# Patient Record
Sex: Female | Born: 1990 | Race: Black or African American | Hispanic: No | Marital: Single | Smoking: Never smoker
Health system: Southern US, Community
[De-identification: ages and names within clinical notes are randomized; demographics above are authoritative.]

## PROBLEM LIST (undated history)

## (undated) ENCOUNTER — Inpatient Hospital Stay (HOSPITAL_COMMUNITY): Payer: Self-pay

## (undated) DIAGNOSIS — K589 Irritable bowel syndrome without diarrhea: Secondary | ICD-10-CM

## (undated) HISTORY — PX: NO PAST SURGERIES: SHX2092

---

## 2012-05-09 ENCOUNTER — Encounter (HOSPITAL_COMMUNITY): Payer: Self-pay | Admitting: Emergency Medicine

## 2012-05-09 ENCOUNTER — Emergency Department (HOSPITAL_COMMUNITY)
Admission: EM | Admit: 2012-05-09 | Discharge: 2012-05-09 | Disposition: A | Discharge status: Home Health | Payer: No Typology Code available for payment source | Attending: Emergency Medicine | Admitting: Emergency Medicine

## 2012-05-09 ENCOUNTER — Emergency Department (HOSPITAL_COMMUNITY): Payer: No Typology Code available for payment source

## 2012-05-09 DIAGNOSIS — Y92410 Unspecified street and highway as the place of occurrence of the external cause: Secondary | ICD-10-CM | POA: Insufficient documentation

## 2012-05-09 DIAGNOSIS — M25511 Pain in right shoulder: Secondary | ICD-10-CM

## 2012-05-09 DIAGNOSIS — T148XXA Other injury of unspecified body region, initial encounter: Secondary | ICD-10-CM | POA: Insufficient documentation

## 2012-05-09 DIAGNOSIS — M25519 Pain in unspecified shoulder: Secondary | ICD-10-CM | POA: Insufficient documentation

## 2012-05-09 MED ORDER — NAPROXEN 250 MG PO TABS: 250.0000 mg | ORAL_TABLET | Freq: Two times a day (BID) | ORAL | Status: AC

## 2012-05-09 MED ORDER — ACETAMINOPHEN 325 MG PO TABS
650.0000 mg | ORAL_TABLET | Freq: Once | ORAL | Status: AC
  Administered 2012-05-09: 650 mg via ORAL
  Filled 2012-05-09: qty 2

## 2012-05-09 MED ORDER — IBUPROFEN 200 MG PO TABS
400.0000 mg | ORAL_TABLET | Freq: Once | ORAL | Status: AC
  Administered 2012-05-09: 400 mg via ORAL
  Filled 2012-05-09: qty 2

## 2012-05-09 MED ORDER — HYDROCODONE-ACETAMINOPHEN 5-325 MG PO TABS: ORAL_TABLET | ORAL | Status: AC

## 2012-05-09 MED ORDER — METAXALONE 800 MG PO TABS: 800.0000 mg | ORAL_TABLET | Freq: Three times a day (TID) | ORAL | Status: AC | PRN

## 2012-05-09 NOTE — Progress Notes (Signed)
Orthopedic Tech Progress Note Patient Details:  [name] 1991/07/10 [id number]  Other Ortho Devices Type of Ortho Device: Other (comment) (arm sling) Ortho Device Location: (R) UE Ortho Device Interventions: Application   [name] 05/09/2012, 1:04 PM
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
## 2012-05-09 NOTE — Discharge Instructions (Signed)
RESOURCE GUIDE  Dental Problems  Patients with Medicaid: [facility]                     [facility] [address]                                           [address] Phone:  [phone]                                                  Phone:  [phone]  If unable to pay or uninsured, contact:  [facility] or [facility] to become qualified for the adult dental clinic.  Chronic Pain Problems Contact [facility]  [phone] Patients need to be referred by their primary care doctor.  Insufficient Money for Medicine Contact [employer]:  call "211" or [facility] 271-5999.  No Primary Care Doctor Call [facility]  [phone] Other agencies that provide inexpensive medical care    [facility]  [phone]    [facility]  [phone]    [facility]  [phone]    Women[facility] Clinic  [phone]    [facility]  [phone]    [facility]  [phone]  Psychological Services [facility]  [phone] [facility]  [phone] [facility]   [phone] (emergency services [phone])  Substance Abuse Resources [facility]  336-882-2125 [facility] 336-784-9470 [facility] 336-285-9073 [facility] 336-845-3988 Residential & Outpatient Substance Abuse Program  800-659-3381  Abuse/Neglect [place] Child Abuse Hotline (336) 641-3795 [place] Child Abuse Hotline 800-378-5315 (After Hours)  Emergency Shelter [facility] (336) 271-5985  Maternity Homes [facility] (336) 275-9566 [facility] (704) 372-4663  MRSA Hotline #:   832-7006    [place] [facility]  [facility] [place]     [employer]                          [facility] [address] [place]                       [address]      [address]  [place]                                                [place]                            [place] Phone:  [phone]                                   Phone:  [phone]                 Phone:  [phone]  [facility] Phone:  [phone]    [facility] Child Abuse Hotline 430-789-0894 5344328569 (After Hours)   Take the prescriptions as directed.  Apply moist heat or ice to the area(s) of discomfort, for 15 minutes at a time, several times per day for the next few days.  Do not fall asleep on a heating or ice pack.  Wear the sling to rest your shoulder for the next 1 to 2 days, then remove and slowly resume your usual activities.  Call your regular medical doctor today and the Orthopedist to schedule a follow up appointment within the next week.  Return to the Emergency Department immediately if worsening.
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
## 2012-05-09 NOTE — ED Notes (Signed)
Discharge instructions reviewed with pt; verbalizes understanding.  No questions asked; no further c/o's voiced.  Sling intact to right shoulder.  Pt ambulatory to lobby.  NAD noted.

## 2016-08-10 LAB — OB RESULTS CONSOLE HEPATITIS B SURFACE ANTIGEN: Hepatitis B Surface Ag: NEGATIVE

## 2016-08-10 LAB — OB RESULTS CONSOLE RUBELLA ANTIBODY, IGM: Rubella: IMMUNE

## 2016-08-10 LAB — OB RESULTS CONSOLE RPR: RPR: NONREACTIVE

## 2016-08-10 LAB — OB RESULTS CONSOLE HIV ANTIBODY (ROUTINE TESTING): HIV: NONREACTIVE

## 2016-08-20 ENCOUNTER — Inpatient Hospital Stay (EMERGENCY_DEPARTMENT_HOSPITAL)
Admission: AD | Admit: 2016-08-20 | Discharge: 2016-08-20 | Disposition: A | Discharge status: Home / Self Care | Payer: 59 | Source: Ambulatory Visit | Attending: Obstetrics and Gynecology | Admitting: Obstetrics and Gynecology

## 2016-08-20 ENCOUNTER — Inpatient Hospital Stay (HOSPITAL_COMMUNITY)
Admission: AD | Admit: 2016-08-20 | Discharge: 2016-08-22 | DRG: 781 | Disposition: A | Discharge status: Home / Self Care | Payer: 59 | Source: Ambulatory Visit | Attending: Obstetrics & Gynecology | Admitting: Obstetrics & Gynecology

## 2016-08-20 ENCOUNTER — Other Ambulatory Visit (HOSPITAL_COMMUNITY): Payer: Self-pay | Admitting: Radiology

## 2016-08-20 ENCOUNTER — Encounter (HOSPITAL_COMMUNITY): Payer: Self-pay | Admitting: *Deleted

## 2016-08-20 ENCOUNTER — Encounter (HOSPITAL_COMMUNITY): Payer: Self-pay | Admitting: Radiology

## 2016-08-20 ENCOUNTER — Encounter (HOSPITAL_COMMUNITY): Payer: Self-pay

## 2016-08-20 ENCOUNTER — Inpatient Hospital Stay (HOSPITAL_COMMUNITY): Payer: 59

## 2016-08-20 DIAGNOSIS — B373 Candidiasis of vulva and vagina: Secondary | ICD-10-CM

## 2016-08-20 DIAGNOSIS — Z3A30 30 weeks gestation of pregnancy: Secondary | ICD-10-CM

## 2016-08-20 DIAGNOSIS — O98813 Other maternal infectious and parasitic diseases complicating pregnancy, third trimester: Secondary | ICD-10-CM | POA: Diagnosis not present

## 2016-08-20 DIAGNOSIS — O2303 Infections of kidney in pregnancy, third trimester: Principal | ICD-10-CM | POA: Diagnosis present

## 2016-08-20 DIAGNOSIS — K5900 Constipation, unspecified: Secondary | ICD-10-CM

## 2016-08-20 DIAGNOSIS — O26893 Other specified pregnancy related conditions, third trimester: Secondary | ICD-10-CM

## 2016-08-20 DIAGNOSIS — R109 Unspecified abdominal pain: Secondary | ICD-10-CM

## 2016-08-20 DIAGNOSIS — K589 Irritable bowel syndrome without diarrhea: Secondary | ICD-10-CM | POA: Insufficient documentation

## 2016-08-20 DIAGNOSIS — O26899 Other specified pregnancy related conditions, unspecified trimester: Secondary | ICD-10-CM

## 2016-08-20 DIAGNOSIS — R102 Pelvic and perineal pain: Secondary | ICD-10-CM | POA: Diagnosis present

## 2016-08-20 DIAGNOSIS — O99613 Diseases of the digestive system complicating pregnancy, third trimester: Secondary | ICD-10-CM | POA: Insufficient documentation

## 2016-08-20 DIAGNOSIS — K56609 Unspecified intestinal obstruction, unspecified as to partial versus complete obstruction: Secondary | ICD-10-CM

## 2016-08-20 DIAGNOSIS — B379 Candidiasis, unspecified: Secondary | ICD-10-CM

## 2016-08-20 HISTORY — DX: Irritable bowel syndrome, unspecified: K58.9

## 2016-08-20 LAB — COMPREHENSIVE METABOLIC PANEL
ALT: 20 U/L (ref 14–54)
ANION GAP: 10 (ref 5–15)
AST: 25 U/L (ref 15–41)
Albumin: 3.4 g/dL — ABNORMAL LOW (ref 3.5–5.0)
Alkaline Phosphatase: 67 U/L (ref 38–126)
BUN: 7 mg/dL (ref 6–20)
CALCIUM: 9.1 mg/dL (ref 8.9–10.3)
CHLORIDE: 102 mmol/L (ref 101–111)
CO2: 19 mmol/L — AB (ref 22–32)
Creatinine, Ser: 0.69 mg/dL (ref 0.44–1.00)
Glucose, Bld: 118 mg/dL — ABNORMAL HIGH (ref 65–99)
Potassium: 4 mmol/L (ref 3.5–5.1)
SODIUM: 131 mmol/L — AB (ref 135–145)
Total Bilirubin: 0.4 mg/dL (ref 0.3–1.2)
Total Protein: 7 g/dL (ref 6.5–8.1)

## 2016-08-20 LAB — CBC WITH DIFFERENTIAL/PLATELET
Basophils Absolute: 0 10*3/uL (ref 0.0–0.1)
Basophils Relative: 0 %
EOS ABS: 0 10*3/uL (ref 0.0–0.7)
EOS PCT: 0 %
HCT: 32.9 % — ABNORMAL LOW (ref 36.0–46.0)
Hemoglobin: 11.4 g/dL — ABNORMAL LOW (ref 12.0–15.0)
LYMPHS ABS: 1 10*3/uL (ref 0.7–4.0)
Lymphocytes Relative: 5 %
MCH: 27.5 pg (ref 26.0–34.0)
MCHC: 34.7 g/dL (ref 30.0–36.0)
MCV: 79.5 fL (ref 78.0–100.0)
MONO ABS: 1 10*3/uL (ref 0.1–1.0)
MONOS PCT: 5 %
Neutro Abs: 18.1 10*3/uL — ABNORMAL HIGH (ref 1.7–7.7)
Neutrophils Relative %: 90 %
PLATELETS: 231 10*3/uL (ref 150–400)
RBC: 4.14 MIL/uL (ref 3.87–5.11)
RDW: 12.9 % (ref 11.5–15.5)
WBC: 20.1 10*3/uL — ABNORMAL HIGH (ref 4.0–10.5)

## 2016-08-20 LAB — URINE MICROSCOPIC-ADD ON

## 2016-08-20 LAB — URINALYSIS, ROUTINE W REFLEX MICROSCOPIC
Bilirubin Urine: NEGATIVE
GLUCOSE, UA: NEGATIVE mg/dL
Hgb urine dipstick: NEGATIVE
KETONES UR: NEGATIVE mg/dL
Nitrite: NEGATIVE
PH: 6.5 (ref 5.0–8.0)
Protein, ur: NEGATIVE mg/dL
SPECIFIC GRAVITY, URINE: 1.025 (ref 1.005–1.030)

## 2016-08-20 LAB — LIPASE, BLOOD: LIPASE: 20 U/L (ref 11–51)

## 2016-08-20 LAB — WET PREP, GENITAL
CLUE CELLS WET PREP: NONE SEEN
Sperm: NONE SEEN
TRICH WET PREP: NONE SEEN

## 2016-08-20 LAB — LACTIC ACID, PLASMA: LACTIC ACID, VENOUS: 1.6 mmol/L (ref 0.5–1.9)

## 2016-08-20 MED ORDER — IOPAMIDOL (ISOVUE-300) INJECTION 61%
100.0000 mL | Freq: Once | INTRAVENOUS | Status: AC | PRN
  Administered 2016-08-20: 100 mL via INTRAVENOUS

## 2016-08-20 MED ORDER — DIATRIZOATE MEGLUMINE & SODIUM 66-10 % PO SOLN
30.0000 mL | Freq: Once | ORAL | Status: AC
  Administered 2016-08-20: 30 mL via ORAL
  Filled 2016-08-20: qty 30

## 2016-08-20 MED ORDER — SODIUM CHLORIDE 0.9 % IV SOLN
INTRAVENOUS | Status: DC
  Administered 2016-08-20 – 2016-08-21 (×2): via INTRAVENOUS

## 2016-08-20 MED ORDER — ZOLPIDEM TARTRATE 5 MG PO TABS
5.0000 mg | ORAL_TABLET | Freq: Every evening | ORAL | Status: DC | PRN
  Administered 2016-08-21 (×2): 5 mg via ORAL
  Filled 2016-08-20 (×2): qty 1

## 2016-08-20 MED ORDER — TERCONAZOLE 0.4 % VA CREA: 1.0000 | TOPICAL_CREAM | Freq: Every day | VAGINAL | 0 refills | Status: DC

## 2016-08-20 MED ORDER — PRENATAL MULTIVITAMIN CH
1.0000 | ORAL_TABLET | Freq: Every day | ORAL | Status: DC
  Administered 2016-08-21: 1 via ORAL
  Filled 2016-08-20 (×2): qty 1

## 2016-08-20 MED ORDER — ONDANSETRON HCL 4 MG/2ML IJ SOLN
4.0000 mg | Freq: Once | INTRAMUSCULAR | Status: AC
  Administered 2016-08-20: 4 mg via INTRAVENOUS
  Filled 2016-08-20: qty 2

## 2016-08-20 MED ORDER — DIATRIZOATE MEGLUMINE & SODIUM 66-10 % PO SOLN
15.0000 mL | Freq: Once | ORAL | Status: AC
  Administered 2016-08-20: 30 mL via ORAL
  Filled 2016-08-20: qty 30

## 2016-08-20 MED ORDER — ACETAMINOPHEN 325 MG PO TABS
650.0000 mg | ORAL_TABLET | ORAL | Status: DC | PRN
  Administered 2016-08-21: 650 mg via ORAL
  Filled 2016-08-20: qty 2

## 2016-08-20 MED ORDER — MORPHINE SULFATE (PF) 4 MG/ML IV SOLN
4.0000 mg | Freq: Once | INTRAVENOUS | Status: AC
  Administered 2016-08-20: 4 mg via INTRAVENOUS
  Filled 2016-08-20: qty 1

## 2016-08-20 MED ORDER — ONDANSETRON HCL 4 MG/2ML IJ SOLN
4.0000 mg | Freq: Once | INTRAMUSCULAR | Status: AC
Start: 2016-08-20 — End: 2016-08-20
  Administered 2016-08-20: 4 mg via INTRAVENOUS
  Filled 2016-08-20: qty 2

## 2016-08-20 MED ORDER — POLYETHYLENE GLYCOL 3350 17 G PO PACK
17.0000 g | PACK | Freq: Every day | ORAL | Status: DC
  Administered 2016-08-21: 17 g via ORAL
  Filled 2016-08-20 (×2): qty 1

## 2016-08-20 MED ORDER — SODIUM CHLORIDE 0.9 % IV BOLUS (SEPSIS)
1000.0000 mL | Freq: Once | INTRAVENOUS | Status: AC
  Administered 2016-08-20: 1000 mL via INTRAVENOUS

## 2016-08-20 MED ORDER — CALCIUM CARBONATE ANTACID 500 MG PO CHEW: 2.0000 | CHEWABLE_TABLET | ORAL | Status: DC | PRN

## 2016-08-20 MED ORDER — DOCUSATE SODIUM 100 MG PO CAPS
100.0000 mg | ORAL_CAPSULE | Freq: Three times a day (TID) | ORAL | Status: DC
  Administered 2016-08-21 (×4): 100 mg via ORAL
  Filled 2016-08-20 (×6): qty 1

## 2016-08-20 MED ORDER — CEFAZOLIN IN D5W 1 GM/50ML IV SOLN
1.0000 g | Freq: Three times a day (TID) | INTRAVENOUS | Status: DC
  Administered 2016-08-21 – 2016-08-22 (×5): 1 g via INTRAVENOUS
  Filled 2016-08-20 (×6): qty 50

## 2016-08-20 NOTE — MAU Note (Addendum)
Pt C/O lower abd pain, was seen in MAU this morning & had enema, pt states pain is worse than before.  Denies bleeding or LOF.  Has vomited twice today.  Pt had good results with enema but thinks she may still be constipated.

## 2016-08-20 NOTE — MAU Note (Signed)
Pt reports she woke up at  3am  feeling uncomfortable with contractions.Denies any vaginal bleeding but reports having a white mucusy discharge yesterday. Good fetal movement felt

## 2016-08-20 NOTE — MAU Provider Note (Signed)
History     CSN: [id number]  Arrival date and time: 08/20/16 1730   First Provider Initiated Contact with Patient 08/20/16 1844      Chief Complaint  Patient presents with  . Abdominal Pain   HPI Patient is a 25 year old G1 P0 at 30 weeks and 1 day who established prenatal care at 28 weeks due to a diagnosis of pregnancy. Patient did present to the MAU this morning with onset of abdominal pain after passing several rockhard stools. She was given a soapsuds enema with good results and discharged home. Patient attempted to go to work 8 a [employer] there began vomiting bile stained fluid and had severe cramping abdominal pain. She has not experienced uterine pain per se but more of a bloating colicky pain in the abdomen. She has not had any more bowel movement since discharge. She did take a dose of MiraLAX. She reports she just feels overall ill.  OB History    Gravida Para Term Preterm AB Living   1             SAB TAB Ectopic Multiple Live Births                  Past Medical History:  Diagnosis Date  . IBS (irritable bowel syndrome)     Past Surgical History:  Procedure Laterality Date  . NO PAST SURGERIES      Family History  Problem Relation Age of Onset  . Cancer Neg Hx   . Diabetes Neg Hx   . Hypertension Neg Hx     Social History  Substance Use Topics  . Smoking status: Never Smoker  . Smokeless tobacco: Never Used  . Alcohol use No    Allergies:  Allergies  Allergen Reactions  . Peanut Butter Flavor Anaphylaxis and Shortness Of Breath    Prescriptions Prior to Admission  Medication Sig Dispense Refill Last Dose  . metroNIDAZOLE (FLAGYL) 500 MG tablet Take 500 mg by mouth 2 (two) times daily.   08/20/2016 at Unknown time  . polyethylene glycol (MIRALAX / GLYCOLAX) packet Take 17 g by mouth daily.   08/20/2016 at Unknown time  . Prenatal Vit-Fe Fumarate-FA (PRENATAL MULTIVITAMIN) TABS tablet Take 1 tablet by mouth daily at 12 noon.   08/19/2016 at  Unknown time  . diphenhydrAMINE (BENADRYL) 25 MG tablet Take 50 mg by mouth every 6 (six) hours as needed. For allergies   Not Taking at Unknown time  . hydroxypropyl methylcellulose (ISOPTO TEARS) 2.5 % ophthalmic solution Place 2 drops into both eyes 4 (four) times daily as needed. For allergies   Not Taking at Unknown time  . terconazole (TERAZOL 7) 0.4 % vaginal cream Place 1 applicator vaginally at bedtime. For 7 nights (Patient not taking: Reported on 08/20/2016) 45 g 0 Not Taking at Unknown time    Review of Systems  Constitutional: Negative for chills and fever.  HENT: Negative for congestion.   Eyes: Negative for blurred vision and double vision.  Respiratory: Negative for cough and sputum production.   Cardiovascular: Negative for chest pain and palpitations.  Gastrointestinal: Positive for abdominal pain, constipation, nausea and vomiting. Negative for heartburn.  Genitourinary: Negative for dysuria, frequency and urgency.  Musculoskeletal: Negative for back pain and myalgias.  Skin: Negative for itching and rash.  Neurological: Negative for dizziness, seizures and headaches.  Endo/Heme/Allergies: Negative for environmental allergies. Does not bruise/bleed easily.  Psychiatric/Behavioral: Negative for depression.   Physical Exam   Blood pressure 114/71, pulse  105, temperature 100.7 F (38.2 C), temperature source Axillary, resp. rate 20, height 5\' 6"  (1.676 m), weight 235 lb (106.6 kg), SpO2 100 %.  Physical Exam  Constitutional: She is oriented to person, place, and time. She appears well-developed and well-nourished.  HENT:  Head: Normocephalic and atraumatic.  Eyes: Conjunctivae are normal. Pupils are equal, round, and reactive to light.  Neck: Normal range of motion. Neck supple.  Cardiovascular: Normal heart sounds.  Exam reveals no gallop and no friction rub.   No murmur heard. Minimally tachycardic  Respiratory: Effort normal and breath sounds normal. No respiratory  distress.  GI: Soft. There is tenderness.  Tympanic, minimal guarding  Musculoskeletal: Normal range of motion. She exhibits no edema or deformity.  Neurological: She is alert and oriented to person, place, and time. No cranial nerve deficit.  Skin: Skin is warm and dry. No rash noted. No erythema.  Psychiatric: She has a normal mood and affect. Her behavior is normal.   Results for orders placed or performed during the hospital encounter of 08/20/16 (from the past 24 hour(s))  CBC with Differential/Platelet     Status: Abnormal   Collection Time: 08/20/16  7:20 PM  Result Value Ref Range   WBC 20.1 (H) 4.0 - 10.5 K/uL   RBC 4.14 3.87 - 5.11 MIL/uL   Hemoglobin 11.4 (L) 12.0 - 15.0 g/dL   HCT 16.1 (L) 09.6 - 04.5 %   MCV 79.5 78.0 - 100.0 fL   MCH 27.5 26.0 - 34.0 pg   MCHC 34.7 30.0 - 36.0 g/dL   RDW 40.9 81.1 - 91.4 %   Platelets 231 150 - 400 K/uL   Neutrophils Relative % 90 %   Neutro Abs 18.1 (H) 1.7 - 7.7 K/uL   Lymphocytes Relative 5 %   Lymphs Abs 1.0 0.7 - 4.0 K/uL   Monocytes Relative 5 %   Monocytes Absolute 1.0 0.1 - 1.0 K/uL   Eosinophils Relative 0 %   Eosinophils Absolute 0.0 0.0 - 0.7 K/uL   Basophils Relative 0 %   Basophils Absolute 0.0 0.0 - 0.1 K/uL  Comprehensive metabolic panel     Status: Abnormal   Collection Time: 08/20/16  7:20 PM  Result Value Ref Range   Sodium 131 (L) 135 - 145 mmol/L   Potassium 4.0 3.5 - 5.1 mmol/L   Chloride 102 101 - 111 mmol/L   CO2 19 (L) 22 - 32 mmol/L   Glucose, Bld 118 (H) 65 - 99 mg/dL   BUN 7 6 - 20 mg/dL   Creatinine, Ser 7.82 0.44 - 1.00 mg/dL   Calcium 9.1 8.9 - 95.6 mg/dL   Total Protein 7.0 6.5 - 8.1 g/dL   Albumin 3.4 (L) 3.5 - 5.0 g/dL   AST 25 15 - 41 U/L   ALT 20 14 - 54 U/L   Alkaline Phosphatase 67 38 - 126 U/L   Total Bilirubin 0.4 0.3 - 1.2 mg/dL   GFR calc non Af Amer >60 >60 mL/min   GFR calc Af Amer >60 >60 mL/min   Anion gap 10 5 - 15  Lipase, blood     Status: None   Collection Time:  08/20/16  7:20 PM  Result Value Ref Range   Lipase 20 11 - 51 U/L  Culture, blood (Routine X 2) w Reflex to ID Panel     Status: None (Preliminary result)   Collection Time: 08/20/16  8:19 PM  Result Value Ref Range   Specimen Description BLOOD  LEFT ARM    Special Requests BOTTLES DRAWN AEROBIC AND ANAEROBIC 5CC    Culture PENDING    Report Status PENDING   Lactic acid, plasma     Status: None   Collection Time: 08/20/16  8:19 PM  Result Value Ref Range   Lactic Acid, Venous 1.6 0.5 - 1.9 mmol/L  Culture, blood (Routine X 2) w Reflex to ID Panel     Status: None (Preliminary result)   Collection Time: 08/20/16  8:33 PM  Result Value Ref Range   Specimen Description BLOOD LEFT ARM    Special Requests BOTTLES DRAWN AEROBIC ONLY 5CC    Culture PENDING    Report Status PENDING    Dg Abd 1 View  Result Date: 08/20/2016 CLINICAL DATA:  Thirty weeks pregnant, lower abdominal pain, vomiting, fever EXAM: ABDOMEN - 1 VIEW COMPARISON:  None. FINDINGS: Gravid uterus.  Vertex position. No evidence of bowel obstruction. No dilated loops of small bowel. Ascending and transverse colon are not decompressed. Visualized osseous structures are within normal limits. IMPRESSION: No evidence of bowel obstruction. Gravid uterus. Electronically Signed   By: Charline Bills M.D.   On: 08/20/2016 20:01   Ct Abdomen Pelvis W Contrast  Result Date: 08/20/2016 CLINICAL DATA:  Abdominal pain concerning for appendicitis. EXAM: CT ABDOMEN AND PELVIS WITH CONTRAST TECHNIQUE: Multidetector CT imaging of the abdomen and pelvis was performed using the standard protocol following bolus administration of intravenous contrast. CONTRAST:  ISOVUE-300 IOPAMIDOL (ISOVUE-300) INJECTION 61% COMPARISON:  None. FINDINGS: Lower chest and abdominal wall:  Negative for pneumonia. Hepatobiliary: No focal liver abnormality.No calcified gallstone or signs of acute appendicitis. Pancreas: Unremarkable. Spleen: Unremarkable.  Adrenals/Urinary Tract: Negative adrenals. Symmetric renal enhancement. No indication of pyelonephritis. Symmetric mild intra renal collecting system prominence, normal in this setting. Unremarkable bladder. Stomach/Bowel: No obstruction. The appendix is visible and unremarkable. Reproductive:Gravid uterus. No indication of previa or subplacental collection. No noted fetal anomaly, but CT is not a substitute for sonography. Vascular/Lymphatic: No acute vascular abnormality. No mass or adenopathy. Other: No ascites or pneumoperitoneum. Musculoskeletal: Negative. IMPRESSION: Negative.  No appendicitis or other explanation for pain. Electronically Signed   By: Marnee Spring M.D.   On: 08/20/2016 23:36   MAU Course  Procedures  MDM In the MAU patient initially appeared rather calm with some minimal abdominal pain but on further evaluation you could see that she was guarding. Patient did attempt initially upon arrival to get up and go to the bathroom she did strain significantly and had no success with having a bowel movement. Patient did feel warm upon her return to the room and temperature was checked and she was febrile to 100.9. At that time IV fluids and pain medicine IV no obvious medicine were started. A flatplate of the abdomen was obtained to look for bowel obstruction. Patient reported improvement of 08/05/2009 pain with dose of 4 mg IV morphine. CBC CMP lactate blood cultures and lipase were obtained.  CBC revealed elevated white count to 20. CMP with no significant abnormality lipase normal and lactate is still pending. I did see non-decompressed loops of bowel but no obstruction or distention on the flatplate of the abdomen. Case was discussed with radiology as well as Dr. Mora Appl at approximately 9:00 PM who recommended CT of the abdomen to assess for appendicitis. I discussed risk and benefits of CT versus MRI with the patient and she did opt for CT scan with oral and IV contrast. This order was  placed and proper  arrangements were made.   Care was signed out to Thressa ShellerHeather Quincy Boy at 1000PM Assessment and Plan   1. Constipation, unspecified constipation type   2. Bowel obstruction (HCC)   3. Abdominal pain affecting pregnancy    Admit     Ernestina Pennaicholas Schenk 08/20/2016, 9:35 PM

## 2016-08-20 NOTE — MAU Provider Note (Signed)
History     CSN: [id number]  Arrival date and time: 08/20/16 [id number]   First Provider Initiated Contact with Patient 08/20/16 [phone]      Chief Complaint  Patient presents with  . Contractions   Heather Murphy is a 25 y.o. G1P0 at 6356w1d who presents today with lower abdominal pain. She states that she she awoke around 0300, and vomited. She then noticed the pain. She states that she feels like this is related to constipation. She reports that her last BM was 08/18/16, and it was hard/formed. She denies any VB or LOF. She confirms normal fetal movement. She is on antibiotics for BV, and has "2 more days left".    Pelvic Pain  The patient's primary symptoms include pelvic pain. This is a new problem. The current episode started today (around 0300. ). The problem occurs intermittently. The problem has been unchanged. The pain is moderate. The problem affects both sides. She is pregnant. Associated symptoms include abdominal pain, constipation (last BM 08/18/16. "Hard balls". ) and vomiting (x1 this morning. ). Pertinent negatives include no chills, diarrhea, dysuria, fever, frequency, nausea or urgency. The vaginal discharge was mucoid. There has been no bleeding. Exacerbated by: certain postions.  She has tried nothing for the symptoms. Sexual activity: No intercourse in the last 24 hours.     Past Medical History:  Diagnosis Date  . IBS (irritable bowel syndrome)     Past Surgical History:  Procedure Laterality Date  . NO PAST SURGERIES      History reviewed. No pertinent family history.  Social History  Substance Use Topics  . Smoking status: Never Smoker  . Smokeless tobacco: Never Used  . Alcohol use No    Allergies:  Allergies  Allergen Reactions  . Peanut Butter Flavor Anaphylaxis and Shortness Of Breath    Prescriptions Prior to Admission  Medication Sig Dispense Refill Last Dose  . cetirizine (ZYRTEC) 10 MG tablet Take 20 mg by mouth daily.   Past Week at Unknown  .  diphenhydrAMINE (BENADRYL) 25 MG tablet Take 50 mg by mouth every 6 (six) hours as needed. For allergies   Past Month at Unknown  . hydroxypropyl methylcellulose (ISOPTO TEARS) 2.5 % ophthalmic solution Place 2 drops into both eyes 4 (four) times daily as needed. For allergies   Past Month at Unknown  . loratadine (CLARITIN) 10 MG tablet Take 10 mg by mouth daily.   Past Month at Unknown    Review of Systems  Constitutional: Negative for chills and fever.  Gastrointestinal: Positive for abdominal pain, constipation (last BM 08/18/16. "Hard balls". ) and vomiting (x1 this morning. ). Negative for diarrhea and nausea.  Genitourinary: Positive for pelvic pain. Negative for dysuria, frequency and urgency.       "I have to sit back to get it out."   Physical Exam   Blood pressure 116/57, pulse 102, temperature 98.1 F (36.7 C), temperature source Oral, resp. rate 18.  Physical Exam  Nursing note and vitals reviewed. Constitutional: She is oriented to person, place, and time. She appears well-developed and well-nourished. No distress.  HENT:  Head: Normocephalic.  Cardiovascular: Normal rate.   Respiratory: Effort normal.  GI: Soft. There is no tenderness. There is no rebound.  Genitourinary:  Genitourinary Comments: External: no lesion Vagina: small amount of thick, white, clumpy discharge Cervix: pink, smooth, no CMT. Closed/thick/ballotable.  Uterus: AGA   Neurological: She is alert and oriented to person, place, and time.  Skin: Skin is warm and  dry.  Psychiatric: She has a normal mood and affect.    FHT: 145, moderate with 15x15 accels, no decels Toco: no UCs  MAU Course  Procedures  MDM 0758: D/W Dr. Mora ApplPinn patient is ok for DC home. Treat yeast infection.  0805: Patient has had soap suds enema and reports good results. She reports feeling better.   Assessment and Plan   1. Constipation, unspecified constipation type   2. Yeast infection   3. [redacted] weeks gestation of  pregnancy    DC home Comfort measures reviewed  3rd Trimester precautions  PTL precautions  Fetal kick counts RX: terazol 7 as directed Return to MAU as needed FU with OB as planned  Follow-up Information    PINN, Sanjuana MaeWALDA STACIA, MD .   Specialty:  Obstetrics and Gynecology Contact information: [address] [place] [address] [phone]            Tawnya CrookHogan, Heather Donovan 08/20/2016, 6:50 AM

## 2016-08-20 NOTE — Discharge Instructions (Signed)
Constipation, Adult Constipation is when a person has fewer than three bowel movements a week, has difficulty having a bowel movement, or has stools that are dry, hard, or larger than normal. As people grow older, constipation is more common. A low-fiber diet, not taking in enough fluids, and taking certain medicines may make constipation worse.  CAUSES   Certain medicines, such as antidepressants, pain medicine, iron supplements, antacids, and water pills.   Certain diseases, such as diabetes, irritable bowel syndrome (IBS), thyroid disease, or depression.   Not drinking enough water.   Not eating enough fiber-rich foods.   Stress or travel.   Lack of physical activity or exercise.   Ignoring the urge to have a bowel movement.   Using laxatives too much.  SIGNS AND SYMPTOMS   Having fewer than three bowel movements a week.   Straining to have a bowel movement.   Having stools that are hard, dry, or larger than normal.   Feeling full or bloated.   Pain in the lower abdomen.   Not feeling relief after having a bowel movement.  DIAGNOSIS  Your health care provider will take a medical history and perform a physical exam. Further testing may be done for severe constipation. Some tests may include:  A barium enema X-ray to examine your rectum, colon, and, sometimes, your small intestine.   A sigmoidoscopy to examine your lower colon.   A colonoscopy to examine your entire colon. TREATMENT  Treatment will depend on the severity of your constipation and what is causing it. Some dietary treatments include drinking more fluids and eating more fiber-rich foods. Lifestyle treatments may include regular exercise. If these diet and lifestyle recommendations do not help, your health care provider may recommend taking over-the-counter laxative medicines to help you have bowel movements. Prescription medicines may be prescribed if over-the-counter medicines do not work.    HOME CARE INSTRUCTIONS   Eat foods that have a lot of fiber, such as fruits, vegetables, whole grains, and beans.  Limit foods high in fat and processed sugars, such as french fries, hamburgers, cookies, candies, and soda.   A fiber supplement may be added to your diet if you cannot get enough fiber from foods.   Drink enough fluids to keep your urine clear or pale yellow.   Exercise regularly or as directed by your health care provider.   Go to the restroom when you have the urge to go. Do not hold it.   Only take over-the-counter or prescription medicines as directed by your health care provider. Do not take other medicines for constipation without talking to your health care provider first.  SEEK IMMEDIATE MEDICAL CARE IF:   You have bright red blood in your stool.   Your constipation lasts for more than 4 days or gets worse.   You have abdominal or rectal pain.   You have thin, pencil-like stools.   You have unexplained weight loss. MAKE SURE YOU:   Understand these instructions.  Will watch your condition.  Will get help right away if you are not doing well or get worse.   This information is not intended to replace advice given to you by your health care provider. Make sure you discuss any questions you have with your health care provider.   Document Released: 09/14/2004 Document Revised: 01/07/2015 Document Reviewed: 09/28/2013 Elsevier Interactive Patient Education 2016 Else[employer] Inc[employer]  Monilial Vaginitis Vaginitis in a soreness, swelling and redness (inflammation) of the vagina and vulva. Monilial vaginitis is not  a sexually transmitted infection. °CAUSES  °Yeast vaginitis is caused by yeast (candida) that is normally found in your vagina. With a yeast infection, the candida has overgrown in number to a point that upsets the chemical balance. °SYMPTOMS  °· White, thick vaginal discharge. °· Swelling, itching, redness and irritation of the vagina and  possibly the lips of the vagina (vulva). °· Burning or painful urination. °· Painful intercourse. °DIAGNOSIS  °Things that may contribute to monilial vaginitis are: °· Postmenopausal and virginal states. °· Pregnancy. °· Infections. °· Being tired, sick or stressed, especially if you had monilial vaginitis in the past. °· Diabetes. Good control will help lower the chance. °· Birth control pills. °· Tight fitting garments. °· Using bubble bath, feminine sprays, douches or deodorant tampons. °· Taking certain medications that kill germs (antibiotics). °· Sporadic recurrence can occur if you become ill. °TREATMENT  °Your caregiver will give you medication. °· There are several kinds of anti monilial vaginal creams and suppositories specific for monilial vaginitis. For recurrent yeast infections, use a suppository or cream in the vagina 2 times a week, or as directed. °· Anti-monilial or steroid cream for the itching or irritation of the vulva may also be used. Get your caregiver's permission. °· Painting the vagina with methylene blue solution may help if the monilial cream does not work. °· Eating yogurt may help prevent monilial vaginitis. °HOME CARE INSTRUCTIONS  °· Finish all medication as prescribed. °· Do not have sex until treatment is completed or after your caregiver tells you it is okay. °· Take warm sitz baths. °· Do not douche. °· Do not use tampons, especially scented ones. °· Wear cotton underwear. °· Avoid tight pants and panty hose. °· Tell your sexual partner that you have a yeast infection. They should go to their caregiver if they have symptoms such as mild rash or itching. °· Your sexual partner should be treated as well if your infection is difficult to eliminate. °· Practice safer sex. Use condoms. °· Some vaginal medications cause latex condoms to fail. Vaginal medications that harm condoms are: °¨ Cleocin cream. °¨ Butoconazole (Femstat®). °¨ Terconazole (Terazol®) vaginal  suppository. °¨ Miconazole (Monistat®) (may be purchased over the counter). °SEEK MEDICAL CARE IF:  °· You have a temperature by mouth above 102° F (38.9° C). °· The infection is getting worse after 2 days of treatment. °· The infection is not getting better after 3 days of treatment. °· You develop blisters in or around your vagina. °· You develop vaginal bleeding, and it is not your menstrual period. °· You have pain when you urinate. °· You develop intestinal problems. °· You have pain with sexual intercourse. °  °This information is not intended to replace advice given to you by your health care provider. Make sure you discuss any questions you have with your health care provider. °  °Document Released: 09/26/2005 Document Revised: 03/10/2012 Document Reviewed: 06/20/2015 °Elsevier Interactive Patient Education ©2016 Elsevier Inc. ° °

## 2016-08-21 ENCOUNTER — Encounter (HOSPITAL_COMMUNITY): Payer: Self-pay

## 2016-08-21 LAB — GC/CHLAMYDIA PROBE AMP (~~LOC~~) NOT AT ARMC
Chlamydia: NEGATIVE
NEISSERIA GONORRHEA: NEGATIVE

## 2016-08-21 LAB — LACTIC ACID, PLASMA: LACTIC ACID, VENOUS: 1.3 mmol/L (ref 0.5–1.9)

## 2016-08-21 LAB — TYPE AND SCREEN
ABO/RH(D): AB POS
Antibody Screen: NEGATIVE

## 2016-08-21 LAB — ABO/RH: ABO/RH(D): AB POS

## 2016-08-21 MED ORDER — LACTATED RINGERS IV SOLN
INTRAVENOUS | Status: DC
  Administered 2016-08-21 – 2016-08-22 (×4): via INTRAVENOUS

## 2016-08-21 MED ORDER — MAGNESIUM CITRATE PO SOLN
1.0000 | Freq: Once | ORAL | Status: AC
Start: 2016-08-21 — End: 2016-08-21
  Administered 2016-08-21: 1 via ORAL
  Filled 2016-08-21: qty 296

## 2016-08-21 MED ORDER — MORPHINE SULFATE (PF) 4 MG/ML IV SOLN
4.0000 mg | Freq: Once | INTRAVENOUS | Status: AC
  Administered 2016-08-21: 4 mg via INTRAVENOUS
  Filled 2016-08-21: qty 1

## 2016-08-21 NOTE — H&P (Signed)
Heather Murphy is a 25 y.o. female G1P0 at 30 weeks 2 days admitted from MAU for severe abdominal pain, fever, chills, nausea and vomiting.  Fever in MAU tmax 100.9.   UA + Large LE WBC 20  Right lower quadrant tenderness so CT Scan done to R/o appendicitis.  Appy negative.   [facility]  OB History    Gravida Para Term Preterm AB Living   1             SAB TAB Ectopic Multiple Live Births                 Past Medical History:  Diagnosis Date  . IBS (irritable bowel syndrome)    Past Surgical History:  Procedure Laterality Date  . NO PAST SURGERIES     Family History: family history is not on file. Social History:  reports that she has never smoked. She has never used smokeless tobacco. She reports that she does not drink alcohol or use drugs.     Maternal Diabetes: No Genetic Screening: Normal Maternal Ultrasounds/Referrals: Normal Fetal Ultrasounds or other Referrals:  Other: Anatomy scan normal Maternal Substance Abuse:  No Significant Maternal Medications:  Meds include: Other: Flagyl Significant Maternal Lab Results:  None Other Comments:  None  Review of Systems  Constitutional: Positive for chills and fever.  Gastrointestinal: Positive for abdominal pain, nausea and vomiting.   Maternal Medical History:  Reason for admission: Nausea.  Severe abdominal pain, fever  Fetal activity: Perceived fetal activity is normal.   Last perceived fetal movement was within the past 24 hours.    Prenatal complications: Infection.   Prenatal Complications - Diabetes: none.    Dilation: Closed Effacement (%): Thick Station: Ballotable Exam by:: [email or web address],rnc Blood pressure 117/65, pulse 93, temperature 99.1 F (37.3 C), temperature source Axillary, resp. rate 20, height 5\' 6"  (1.676 m), weight 106.6 kg (235 lb), SpO2 100 %. Maternal Exam:  Abdomen: Patient reports the following abdominal tenderness: RLQ.  Fundal height is 30  cm.   Fetal presentation: vertex  Introitus: Normal vulva. Vagina is positive for vaginal discharge.  Ferning test: not done.  Nitrazine test: not done.  Cervix: Cervix evaluated by sterile speculum exam and digital exam.   By MAU Fellow: Closed/ thick / high  Fetal Exam Fetal Monitor Review: Baseline rate: 150.  Variability: moderate (6-25 bpm).   Pattern: no decelerations and accelerations present.    Fetal State Assessment: Category I - tracings are normal.     Physical Exam  Nursing note and vitals reviewed. Constitutional: She is oriented to person, place, and time.  GI: She exhibits distension. There is tenderness in the right lower quadrant. There is no rebound and no guarding.  Genitourinary: Vaginal discharge found.  Neurological: She is alert and oriented to person, place, and time.    Prenatal labs: ABO, Rh: --/--/AB POS (08/21 1920) Antibody: NEG (08/21 1920) Rubella:  Immune RPR:   NR HBsAg:   Neg HIV:   Neg GBS:  Not done  Assessment/Plan: 25 yo G1P0 at 30 weeks 2 days severe abdominal pain, fever, N/V  Possible pyelonephritis, appendicitis and bowel obstruction ruled out.  Urine and blood cultures pending  Ancef 2GM IV q8 hours IV hydration  Tylenol prn pain and fever Clear liquids for now  Heather Murphy 08/21/2016, 1:47 AM

## 2016-08-21 NOTE — Progress Notes (Signed)
HD#2 Pt states that her pain has improved dramatically. Now with only mild right flank pain and mild LAP. No fever. Cultures pending.  VSSAF IMP/ Likely pyelo Plan/ await cultures

## 2016-08-22 MED ORDER — CEPHALEXIN 500 MG PO CAPS: 500.0000 mg | ORAL_CAPSULE | Freq: Four times a day (QID) | ORAL | 0 refills | Status: AC

## 2016-08-22 MED ORDER — HYDROCORTISONE ACE-PRAMOXINE 2.5-1 % EX CREA: 1.0000 "application " | TOPICAL_CREAM | Freq: Two times a day (BID) | CUTANEOUS | 1 refills | Status: DC

## 2016-08-22 MED ORDER — FLUCONAZOLE 150 MG PO TABS: 150.0000 mg | ORAL_TABLET | ORAL | 0 refills | Status: AC

## 2016-08-22 NOTE — Discharge Summary (Signed)
Obstetric Discharge Summary Reason for Admission: Abdominal pain Prenatal Procedures: NST Intrapartum Procedures: NA, undelivered Postpartum Procedures: NA, undelivered Complications-Operative and Postpartum: NA, undelivered Hemoglobin  Date Value Ref Range Status  08/20/2016 11.4 (L) 12.0 - 15.0 g/dL Final   HCT  Date Value Ref Range Status  08/20/2016 32.9 (L) 36.0 - 46.0 % Final    Physical Exam:  General: alert, cooperative and appears stated age Abd soft, NT/ND, no CVA tenderness, no rebound or guarding NST reactive, cat 1 tracing  Discharge Diagnoses: Pyelonephritis, constipation  Discharge Information: Date: 08/22/2016 Activity: unrestricted Diet: routine Medications: Keflex, hydrocortisone-pramoxine Condition: improved Instructions: refer to practice specific booklet Discharge to: home Follow-up Information    PINN, Sanjuana MaeWALDA STACIA, MD Follow up in 1 week(s).   Specialty:  Obstetrics and Gynecology Why:  For a prenatal appointment Contact information: [address] [place] [address] [phone]            Almon HerculesOSS,Jaelynn Currier H. 08/22/2016, 9:47 AM

## 2016-08-23 LAB — URINE CULTURE: Culture: 50000 — AB

## 2016-08-25 LAB — CULTURE, BLOOD (ROUTINE X 2)
Culture: NO GROWTH
Culture: NO GROWTH

## 2016-10-03 ENCOUNTER — Other Ambulatory Visit: Payer: Self-pay | Admitting: Obstetrics and Gynecology

## 2016-10-08 LAB — OB RESULTS CONSOLE GBS: STREP GROUP B AG: NEGATIVE

## 2016-10-08 LAB — OB RESULTS CONSOLE GC/CHLAMYDIA
Chlamydia: NEGATIVE
GC PROBE AMP, GENITAL: NEGATIVE

## 2016-10-20 ENCOUNTER — Encounter (HOSPITAL_COMMUNITY): Payer: Self-pay

## 2016-10-20 ENCOUNTER — Inpatient Hospital Stay (HOSPITAL_COMMUNITY): Payer: 59 | Admitting: Anesthesiology

## 2016-10-20 ENCOUNTER — Inpatient Hospital Stay (HOSPITAL_COMMUNITY)
Admission: AD | Admit: 2016-10-20 | Discharge: 2016-10-24 | DRG: 765 | Disposition: A | Discharge status: Home / Self Care | Payer: 59 | Source: Ambulatory Visit | Attending: Obstetrics & Gynecology | Admitting: Obstetrics & Gynecology

## 2016-10-20 ENCOUNTER — Inpatient Hospital Stay (HOSPITAL_COMMUNITY)
Admission: AD | Admit: 2016-10-20 | Discharge: 2016-10-20 | Disposition: A | Discharge status: Home / Self Care | Payer: 59 | Source: Ambulatory Visit | Attending: Obstetrics & Gynecology | Admitting: Obstetrics & Gynecology

## 2016-10-20 DIAGNOSIS — Z6841 Body Mass Index (BMI) 40.0 and over, adult: Secondary | ICD-10-CM | POA: Diagnosis not present

## 2016-10-20 DIAGNOSIS — Z3A38 38 weeks gestation of pregnancy: Secondary | ICD-10-CM | POA: Diagnosis not present

## 2016-10-20 DIAGNOSIS — O99824 Streptococcus B carrier state complicating childbirth: Secondary | ICD-10-CM | POA: Diagnosis present

## 2016-10-20 DIAGNOSIS — O169 Unspecified maternal hypertension, unspecified trimester: Secondary | ICD-10-CM | POA: Insufficient documentation

## 2016-10-20 DIAGNOSIS — Z3A00 Weeks of gestation of pregnancy not specified: Secondary | ICD-10-CM | POA: Insufficient documentation

## 2016-10-20 DIAGNOSIS — O99214 Obesity complicating childbirth: Secondary | ICD-10-CM | POA: Diagnosis present

## 2016-10-20 DIAGNOSIS — O411230 Chorioamnionitis, third trimester, not applicable or unspecified: Secondary | ICD-10-CM | POA: Diagnosis present

## 2016-10-20 DIAGNOSIS — Z3403 Encounter for supervision of normal first pregnancy, third trimester: Secondary | ICD-10-CM | POA: Diagnosis present

## 2016-10-20 LAB — URINE MICROSCOPIC-ADD ON

## 2016-10-20 LAB — COMPREHENSIVE METABOLIC PANEL
ALBUMIN: 3.3 g/dL — AB (ref 3.5–5.0)
ALK PHOS: 108 U/L (ref 38–126)
ALT: 17 U/L (ref 14–54)
AST: 21 U/L (ref 15–41)
Anion gap: 12 (ref 5–15)
BILIRUBIN TOTAL: 0.4 mg/dL (ref 0.3–1.2)
BUN: 7 mg/dL (ref 6–20)
CALCIUM: 9.4 mg/dL (ref 8.9–10.3)
CO2: 18 mmol/L — AB (ref 22–32)
CREATININE: 0.6 mg/dL (ref 0.44–1.00)
Chloride: 102 mmol/L (ref 101–111)
GFR calc Af Amer: >60 mL/min (ref >60)
GFR calc non Af Amer: >60 mL/min (ref >60)
GLUCOSE: 71 mg/dL (ref 65–99)
Potassium: 4 mmol/L (ref 3.5–5.1)
SODIUM: 132 mmol/L — AB (ref 135–145)
TOTAL PROTEIN: 7.1 g/dL (ref 6.5–8.1)

## 2016-10-20 LAB — CBC
HEMATOCRIT: 33.4 % — AB (ref 36.0–46.0)
HEMOGLOBIN: 11.4 g/dL — AB (ref 12.0–15.0)
MCH: 27.6 pg (ref 26.0–34.0)
MCHC: 34.1 g/dL (ref 30.0–36.0)
MCV: 80.9 fL (ref 78.0–100.0)
Platelets: 199 10*3/uL (ref 150–400)
RBC: 4.13 MIL/uL (ref 3.87–5.11)
RDW: 14.1 % (ref 11.5–15.5)
WBC: 12.7 10*3/uL — ABNORMAL HIGH (ref 4.0–10.5)

## 2016-10-20 LAB — URINALYSIS, ROUTINE W REFLEX MICROSCOPIC
Bilirubin Urine: NEGATIVE
GLUCOSE, UA: NEGATIVE mg/dL
Ketones, ur: 40 mg/dL — AB
Nitrite: NEGATIVE
PH: 7.5 (ref 5.0–8.0)
Protein, ur: NEGATIVE mg/dL
SPECIFIC GRAVITY, URINE: 1.01 (ref 1.005–1.030)

## 2016-10-20 LAB — PROTEIN / CREATININE RATIO, URINE
Creatinine, Urine: 93 mg/dL
Protein Creatinine Ratio: 0.13 mg/mg{Cre} (ref 0.00–0.15)
Total Protein, Urine: 12 mg/dL

## 2016-10-20 LAB — OB RESULTS CONSOLE GBS: GBS: POSITIVE

## 2016-10-20 LAB — TYPE AND SCREEN
ABO/RH(D): AB POS
Antibody Screen: NEGATIVE

## 2016-10-20 MED ORDER — EPHEDRINE 5 MG/ML INJ: 10.0000 mg | INTRAVENOUS | Status: DC | PRN

## 2016-10-20 MED ORDER — FENTANYL 2.5 MCG/ML BUPIVACAINE 1/10 % EPIDURAL INFUSION (WH - ANES)
14.0000 mL/h | INTRAMUSCULAR | Status: DC | PRN
  Administered 2016-10-20 – 2016-10-21 (×2): 14 mL/h via EPIDURAL
  Filled 2016-10-20 (×2): qty 125

## 2016-10-20 MED ORDER — PHENYLEPHRINE 40 MCG/ML (10ML) SYRINGE FOR IV PUSH (FOR BLOOD PRESSURE SUPPORT)
80.0000 ug | PREFILLED_SYRINGE | INTRAVENOUS | Status: DC | PRN
  Filled 2016-10-20: qty 10

## 2016-10-20 MED ORDER — LACTATED RINGERS IV SOLN: 500.0000 mL | Freq: Once | INTRAVENOUS | Status: DC

## 2016-10-20 MED ORDER — OXYCODONE-ACETAMINOPHEN 5-325 MG PO TABS: 2.0000 | ORAL_TABLET | ORAL | Status: DC | PRN

## 2016-10-20 MED ORDER — ACETAMINOPHEN 325 MG PO TABS: 650.0000 mg | ORAL_TABLET | ORAL | Status: DC | PRN

## 2016-10-20 MED ORDER — PENICILLIN G POTASSIUM 5000000 UNITS IJ SOLR
2.5000 10*6.[IU] | INTRAVENOUS | Status: DC
  Administered 2016-10-20 – 2016-10-21 (×2): 2.5 10*6.[IU] via INTRAVENOUS
  Filled 2016-10-20 (×5): qty 2.5

## 2016-10-20 MED ORDER — LACTATED RINGERS IV SOLN
500.0000 mL | Freq: Once | INTRAVENOUS | Status: AC
  Administered 2016-10-20: 500 mL via INTRAVENOUS

## 2016-10-20 MED ORDER — LACTATED RINGERS IV SOLN
INTRAVENOUS | Status: DC
  Administered 2016-10-20 – 2016-10-21 (×2): via INTRAVENOUS

## 2016-10-20 MED ORDER — ONDANSETRON HCL 4 MG/2ML IJ SOLN: 4.0000 mg | Freq: Four times a day (QID) | INTRAMUSCULAR | Status: DC | PRN

## 2016-10-20 MED ORDER — OXYCODONE-ACETAMINOPHEN 5-325 MG PO TABS
1.0000 | ORAL_TABLET | Freq: Once | ORAL | Status: AC
  Administered 2016-10-20: 1 via ORAL
  Filled 2016-10-20: qty 1

## 2016-10-20 MED ORDER — FLEET ENEMA 7-19 GM/118ML RE ENEM: 1.0000 | ENEMA | RECTAL | Status: DC | PRN

## 2016-10-20 MED ORDER — LACTATED RINGERS IV SOLN
500.0000 mL | INTRAVENOUS | Status: DC | PRN
  Administered 2016-10-20 – 2016-10-21 (×2): 500 mL via INTRAVENOUS

## 2016-10-20 MED ORDER — OXYCODONE-ACETAMINOPHEN 5-325 MG PO TABS: 1.0000 | ORAL_TABLET | ORAL | Status: DC | PRN

## 2016-10-20 MED ORDER — SOD CITRATE-CITRIC ACID 500-334 MG/5ML PO SOLN
30.0000 mL | ORAL | Status: DC | PRN
  Administered 2016-10-20 – 2016-10-21 (×2): 30 mL via ORAL
  Filled 2016-10-20 (×2): qty 15

## 2016-10-20 MED ORDER — OXYTOCIN BOLUS FROM INFUSION: 500.0000 mL | Freq: Once | INTRAVENOUS | Status: DC

## 2016-10-20 MED ORDER — LIDOCAINE HCL (PF) 1 % IJ SOLN
INTRAMUSCULAR | Status: DC | PRN
  Administered 2016-10-20: 5 mL via EPIDURAL
  Administered 2016-10-20: 3 mL via EPIDURAL
  Administered 2016-10-20: 2 mL via EPIDURAL

## 2016-10-20 MED ORDER — PHENYLEPHRINE 40 MCG/ML (10ML) SYRINGE FOR IV PUSH (FOR BLOOD PRESSURE SUPPORT): 80.0000 ug | PREFILLED_SYRINGE | INTRAVENOUS | Status: DC | PRN

## 2016-10-20 MED ORDER — OXYTOCIN 40 UNITS IN LACTATED RINGERS INFUSION - SIMPLE MED: 2.5000 [IU]/h | INTRAVENOUS | Status: DC

## 2016-10-20 MED ORDER — LIDOCAINE HCL (PF) 1 % IJ SOLN
30.0000 mL | INTRAMUSCULAR | Status: DC | PRN
  Filled 2016-10-20: qty 30

## 2016-10-20 MED ORDER — DIPHENHYDRAMINE HCL 50 MG/ML IJ SOLN: 12.5000 mg | INTRAMUSCULAR | Status: DC | PRN

## 2016-10-20 MED ORDER — PENICILLIN G POTASSIUM 5000000 UNITS IJ SOLR
5.0000 10*6.[IU] | Freq: Once | INTRAVENOUS | Status: AC
  Administered 2016-10-20: 5 10*6.[IU] via INTRAVENOUS
  Filled 2016-10-20: qty 5

## 2016-10-20 NOTE — Anesthesia Preprocedure Evaluation (Signed)
Anesthesia Evaluation  Patient identified by MRN, date of birth, ID band Patient awake    Reviewed: Allergy & Precautions, NPO status , Patient's Chart, lab work & pertinent test results  Airway Mallampati: II  TM Distance: >3 FB Neck ROM: Full    Dental  (+) Teeth Intact, Dental Advisory Given   Pulmonary neg pulmonary ROS,    Pulmonary exam normal breath sounds clear to auscultation       Cardiovascular negative cardio ROS Normal cardiovascular exam Rhythm:Regular Rate:Normal     Neuro/Psych negative neurological ROS     GI/Hepatic negative GI ROS, Neg liver ROS,   Endo/Other  Morbid obesity  Renal/GU negative Renal ROS     Musculoskeletal negative musculoskeletal ROS (+)   Abdominal   Peds  Hematology  (+) Blood dyscrasia, anemia , Plt 199k   Anesthesia Other Findings Day of surgery medications reviewed with the patient.  Reproductive/Obstetrics (+) Pregnancy                             Anesthesia Physical Anesthesia Plan  ASA: III  Anesthesia Plan: Epidural   Post-op Pain Management:    Induction:   Airway Management Planned:   Additional Equipment:   Intra-op Plan:   Post-operative Plan:   Informed Consent: I have reviewed the patients History and Physical, chart, labs and discussed the procedure including the risks, benefits and alternatives for the proposed anesthesia with the patient or authorized representative who has indicated his/her understanding and acceptance.   Dental advisory given  Plan Discussed with:   Anesthesia Plan Comments: (Patient identified. Risks/Benefits/Options discussed with patient including but not limited to bleeding, infection, nerve damage, paralysis, failed block, incomplete pain control, headache, blood pressure changes, nausea, vomiting, reactions to medication both or allergic, itching and postpartum back pain. Confirmed with bedside  nurse the patient's most recent platelet count. Confirmed with patient that they are not currently taking any anticoagulation, have any bleeding history or any family history of bleeding disorders. Patient expressed understanding and wished to proceed. All questions were answered. )        Anesthesia Quick Evaluation

## 2016-10-20 NOTE — MAU Note (Signed)
Attempted PIV 3 times.

## 2016-10-20 NOTE — MAU Note (Signed)
Ctx getting stronger since this morning.

## 2016-10-20 NOTE — Anesthesia Procedure Notes (Signed)
Epidural Patient location during procedure: OB  Staffing Anesthesiologist: Dani Wallner EDWARD Performed: anesthesiologist   Preanesthetic Checklist Completed: patient identified, pre-op evaluation, timeout performed, IV checked, risks and benefits discussed and monitors and equipment checked  Epidural Patient position: sitting Prep: DuraPrep Patient monitoring: blood pressure and continuous pulse ox Approach: midline Location: L3-L4 Injection technique: LOR air  Needle:  Needle type: Tuohy  Needle gauge: 17 G Needle length: 9 cm Needle insertion depth: 7 cm Catheter size: 19 Gauge Catheter at skin depth: 12 cm Test dose: negative and Other (1% Lidocaine)  Additional Notes Patient identified.  Risk benefits discussed including failed block, incomplete pain control, headache, nerve damage, paralysis, blood pressure changes, nausea, vomiting, reactions to medication both toxic or allergic, and postpartum back pain.  Patient expressed understanding and wished to proceed.  All questions were answered.  Sterile technique used throughout procedure and epidural site dressed with sterile barrier dressing. No paresthesia or other complications noted. The patient did not experience any signs of intravascular injection such as tinnitus or metallic taste in mouth nor signs of intrathecal spread such as rapid motor block. Please see nursing notes for vital signs. Reason for block:procedure for pain     

## 2016-10-21 ENCOUNTER — Encounter (HOSPITAL_COMMUNITY)
Admission: AD | Disposition: A | Discharge status: Home / Self Care | Payer: Self-pay | Source: Ambulatory Visit | Attending: Obstetrics & Gynecology

## 2016-10-21 ENCOUNTER — Encounter (HOSPITAL_COMMUNITY): Payer: Self-pay | Admitting: *Deleted

## 2016-10-21 LAB — RPR: RPR Ser Ql: NONREACTIVE

## 2016-10-21 LAB — CBC
HCT: 28.9 % — ABNORMAL LOW (ref 36.0–46.0)
HEMOGLOBIN: 9.8 g/dL — AB (ref 12.0–15.0)
MCH: 27.2 pg (ref 26.0–34.0)
MCHC: 33.9 g/dL (ref 30.0–36.0)
MCV: 80.3 fL (ref 78.0–100.0)
Platelets: 195 10*3/uL (ref 150–400)
RBC: 3.6 MIL/uL — ABNORMAL LOW (ref 3.87–5.11)
RDW: 14 % (ref 11.5–15.5)
WBC: 15.8 10*3/uL — ABNORMAL HIGH (ref 4.0–10.5)

## 2016-10-21 SURGERY — Surgical Case
Anesthesia: Regional

## 2016-10-21 MED ORDER — TETANUS-DIPHTH-ACELL PERTUSSIS 5-2.5-18.5 LF-MCG/0.5 IM SUSP: 0.5000 mL | Freq: Once | INTRAMUSCULAR | Status: DC

## 2016-10-21 MED ORDER — OXYCODONE HCL 5 MG PO TABS
5.0000 mg | ORAL_TABLET | ORAL | Status: DC | PRN
  Administered 2016-10-22: 5 mg via ORAL
  Filled 2016-10-21: qty 1

## 2016-10-21 MED ORDER — MEPERIDINE HCL 25 MG/ML IJ SOLN
INTRAMUSCULAR | Status: AC
  Filled 2016-10-21: qty 1

## 2016-10-21 MED ORDER — NALBUPHINE HCL 10 MG/ML IJ SOLN: 5.0000 mg | INTRAMUSCULAR | Status: DC | PRN

## 2016-10-21 MED ORDER — MORPHINE SULFATE (PF) 0.5 MG/ML IJ SOLN
INTRAMUSCULAR | Status: AC
  Filled 2016-10-21: qty 10

## 2016-10-21 MED ORDER — LACTATED RINGERS IV SOLN
INTRAVENOUS | Status: DC | PRN
  Administered 2016-10-21: 04:00:00 via INTRAVENOUS

## 2016-10-21 MED ORDER — SODIUM CHLORIDE 0.9 % IR SOLN
Status: DC | PRN
  Administered 2016-10-21: 1

## 2016-10-21 MED ORDER — LACTATED RINGERS IV SOLN
INTRAVENOUS | Status: DC
  Administered 2016-10-21 – 2016-10-22 (×3): via INTRAVENOUS

## 2016-10-21 MED ORDER — NALOXONE HCL 0.4 MG/ML IJ SOLN: 0.4000 mg | INTRAMUSCULAR | Status: DC | PRN

## 2016-10-21 MED ORDER — NALOXONE HCL 2 MG/2ML IJ SOSY
1.0000 ug/kg/h | PREFILLED_SYRINGE | INTRAVENOUS | Status: DC | PRN
  Filled 2016-10-21: qty 2

## 2016-10-21 MED ORDER — IBUPROFEN 600 MG PO TABS
600.0000 mg | ORAL_TABLET | Freq: Four times a day (QID) | ORAL | Status: DC
  Administered 2016-10-21 – 2016-10-24 (×11): 600 mg via ORAL
  Filled 2016-10-21 (×12): qty 1

## 2016-10-21 MED ORDER — DIPHENHYDRAMINE HCL 25 MG PO CAPS
25.0000 mg | ORAL_CAPSULE | ORAL | Status: DC | PRN
  Administered 2016-10-21: 25 mg via ORAL

## 2016-10-21 MED ORDER — LIDOCAINE-EPINEPHRINE (PF) 2 %-1:200000 IJ SOLN
INTRAMUSCULAR | Status: AC
  Filled 2016-10-21: qty 20

## 2016-10-21 MED ORDER — MENTHOL 3 MG MT LOZG: 1.0000 | LOZENGE | OROMUCOSAL | Status: DC | PRN

## 2016-10-21 MED ORDER — ZOLPIDEM TARTRATE 5 MG PO TABS
5.0000 mg | ORAL_TABLET | Freq: Every evening | ORAL | Status: DC | PRN
  Administered 2016-10-24: 5 mg via ORAL
  Filled 2016-10-21: qty 1

## 2016-10-21 MED ORDER — SENNOSIDES-DOCUSATE SODIUM 8.6-50 MG PO TABS
2.0000 | ORAL_TABLET | ORAL | Status: DC
  Administered 2016-10-22 – 2016-10-23 (×3): 2 via ORAL
  Filled 2016-10-21 (×2): qty 2

## 2016-10-21 MED ORDER — OXYTOCIN 10 UNIT/ML IJ SOLN
INTRAVENOUS | Status: DC | PRN
  Administered 2016-10-21: 40 [IU] via INTRAVENOUS

## 2016-10-21 MED ORDER — KETOROLAC TROMETHAMINE 30 MG/ML IJ SOLN: 30.0000 mg | Freq: Four times a day (QID) | INTRAMUSCULAR | Status: AC | PRN

## 2016-10-21 MED ORDER — BUPIVACAINE HCL (PF) 0.25 % IJ SOLN
INTRAMUSCULAR | Status: AC
  Filled 2016-10-21: qty 10

## 2016-10-21 MED ORDER — SCOPOLAMINE 1 MG/3DAYS TD PT72
MEDICATED_PATCH | TRANSDERMAL | Status: DC | PRN
  Administered 2016-10-21: 1 via TRANSDERMAL

## 2016-10-21 MED ORDER — DIPHENHYDRAMINE HCL 50 MG/ML IJ SOLN: 12.5000 mg | INTRAMUSCULAR | Status: DC | PRN

## 2016-10-21 MED ORDER — OXYTOCIN 10 UNIT/ML IJ SOLN
INTRAMUSCULAR | Status: AC
  Filled 2016-10-21: qty 4

## 2016-10-21 MED ORDER — LACTATED RINGERS IV SOLN
INTRAVENOUS | Status: DC | PRN
  Administered 2016-10-21 (×2): via INTRAVENOUS

## 2016-10-21 MED ORDER — PROMETHAZINE HCL 25 MG/ML IJ SOLN: 6.2500 mg | INTRAMUSCULAR | Status: DC | PRN

## 2016-10-21 MED ORDER — PRENATAL MULTIVITAMIN CH
1.0000 | ORAL_TABLET | Freq: Every day | ORAL | Status: DC
  Administered 2016-10-21 – 2016-10-24 (×4): 1 via ORAL
  Filled 2016-10-21 (×4): qty 1

## 2016-10-21 MED ORDER — SODIUM CHLORIDE 0.9% FLUSH: 3.0000 mL | INTRAVENOUS | Status: DC | PRN

## 2016-10-21 MED ORDER — SIMETHICONE 80 MG PO CHEW
80.0000 mg | CHEWABLE_TABLET | ORAL | Status: DC | PRN
  Filled 2016-10-21: qty 1

## 2016-10-21 MED ORDER — OXYCODONE HCL 5 MG PO TABS: 10.0000 mg | ORAL_TABLET | ORAL | Status: DC | PRN

## 2016-10-21 MED ORDER — FENTANYL CITRATE (PF) 100 MCG/2ML IJ SOLN: 25.0000 ug | INTRAMUSCULAR | Status: DC | PRN

## 2016-10-21 MED ORDER — MEPERIDINE HCL 25 MG/ML IJ SOLN: 6.2500 mg | INTRAMUSCULAR | Status: DC | PRN

## 2016-10-21 MED ORDER — CEFAZOLIN SODIUM-DEXTROSE 2-3 GM-% IV SOLR
INTRAVENOUS | Status: DC | PRN
  Administered 2016-10-21: 2 g via INTRAVENOUS

## 2016-10-21 MED ORDER — ACETAMINOPHEN 500 MG PO TABS
1000.0000 mg | ORAL_TABLET | ORAL | Status: DC | PRN
  Administered 2016-10-21: 1000 mg via ORAL
  Filled 2016-10-21: qty 2

## 2016-10-21 MED ORDER — ACETAMINOPHEN 500 MG PO TABS
1000.0000 mg | ORAL_TABLET | Freq: Four times a day (QID) | ORAL | Status: AC
  Administered 2016-10-21 (×2): 1000 mg via ORAL
  Filled 2016-10-21 (×2): qty 2

## 2016-10-21 MED ORDER — SIMETHICONE 80 MG PO CHEW
80.0000 mg | CHEWABLE_TABLET | ORAL | Status: DC
  Administered 2016-10-22 – 2016-10-23 (×2): 80 mg via ORAL
  Filled 2016-10-21 (×2): qty 1

## 2016-10-21 MED ORDER — COCONUT OIL OIL: 1.0000 "application " | TOPICAL_OIL | Status: DC | PRN

## 2016-10-21 MED ORDER — SODIUM BICARBONATE 8.4 % IV SOLN
INTRAVENOUS | Status: AC
  Filled 2016-10-21: qty 50

## 2016-10-21 MED ORDER — OXYTOCIN 40 UNITS IN LACTATED RINGERS INFUSION - SIMPLE MED: 2.5000 [IU]/h | INTRAVENOUS | Status: AC

## 2016-10-21 MED ORDER — ACETAMINOPHEN 325 MG PO TABS
650.0000 mg | ORAL_TABLET | ORAL | Status: DC | PRN
  Administered 2016-10-22 (×2): 650 mg via ORAL
  Filled 2016-10-21 (×2): qty 2

## 2016-10-21 MED ORDER — SCOPOLAMINE 1 MG/3DAYS TD PT72
MEDICATED_PATCH | TRANSDERMAL | Status: AC
  Filled 2016-10-21: qty 1

## 2016-10-21 MED ORDER — SIMETHICONE 80 MG PO CHEW
80.0000 mg | CHEWABLE_TABLET | Freq: Three times a day (TID) | ORAL | Status: DC
  Administered 2016-10-21 – 2016-10-24 (×10): 80 mg via ORAL
  Filled 2016-10-21 (×10): qty 1

## 2016-10-21 MED ORDER — MEPERIDINE HCL 25 MG/ML IJ SOLN
INTRAMUSCULAR | Status: DC | PRN
  Administered 2016-10-21 (×2): 12.5 mg via INTRAVENOUS

## 2016-10-21 MED ORDER — MORPHINE SULFATE (PF) 0.5 MG/ML IJ SOLN
INTRAMUSCULAR | Status: DC | PRN
  Administered 2016-10-21: 4 mg via EPIDURAL

## 2016-10-21 MED ORDER — BUPIVACAINE HCL 0.25 % IJ SOLN
INTRAMUSCULAR | Status: DC | PRN
  Administered 2016-10-21: 10 mL

## 2016-10-21 MED ORDER — SODIUM CHLORIDE 0.9 % IV SOLN
3.0000 g | Freq: Four times a day (QID) | INTRAVENOUS | Status: AC
  Administered 2016-10-21 – 2016-10-22 (×6): 3 g via INTRAVENOUS
  Filled 2016-10-21 (×7): qty 3

## 2016-10-21 MED ORDER — SODIUM BICARBONATE 8.4 % IV SOLN
INTRAVENOUS | Status: DC | PRN
  Administered 2016-10-21 (×3): 5 mL via EPIDURAL

## 2016-10-21 MED ORDER — ONDANSETRON HCL 4 MG/2ML IJ SOLN: 4.0000 mg | Freq: Three times a day (TID) | INTRAMUSCULAR | Status: DC | PRN

## 2016-10-21 MED ORDER — DIBUCAINE 1 % RE OINT: 1.0000 "application " | TOPICAL_OINTMENT | RECTAL | Status: DC | PRN

## 2016-10-21 MED ORDER — ONDANSETRON HCL 4 MG/2ML IJ SOLN
INTRAMUSCULAR | Status: AC
  Filled 2016-10-21: qty 2

## 2016-10-21 MED ORDER — WITCH HAZEL-GLYCERIN EX PADS: 1.0000 "application " | MEDICATED_PAD | CUTANEOUS | Status: DC | PRN

## 2016-10-21 MED ORDER — DIPHENHYDRAMINE HCL 25 MG PO CAPS
25.0000 mg | ORAL_CAPSULE | Freq: Four times a day (QID) | ORAL | Status: DC | PRN
  Filled 2016-10-21: qty 1

## 2016-10-21 MED ORDER — NALBUPHINE HCL 10 MG/ML IJ SOLN: 5.0000 mg | Freq: Once | INTRAMUSCULAR | Status: DC | PRN

## 2016-10-21 MED ORDER — CEFAZOLIN SODIUM-DEXTROSE 2-4 GM/100ML-% IV SOLN
INTRAVENOUS | Status: AC
Start: 2016-10-21 — End: 2016-10-21
  Filled 2016-10-21: qty 100

## 2016-10-21 MED ORDER — ONDANSETRON HCL 4 MG/2ML IJ SOLN
INTRAMUSCULAR | Status: DC | PRN
  Administered 2016-10-21: 4 mg via INTRAVENOUS

## 2016-10-21 SURGICAL SUPPLY — 38 items
APL SKNCLS STERI-STRIP NONHPOA (GAUZE/BANDAGES/DRESSINGS) ×1
BENZOIN TINCTURE PRP APPL 2/3 (GAUZE/BANDAGES/DRESSINGS) ×3 IMPLANT
CHLORAPREP W/TINT 26ML (MISCELLANEOUS) ×3 IMPLANT
CLAMP CORD UMBIL (MISCELLANEOUS) IMPLANT
CLOSURE WOUND 1/2 X4 (GAUZE/BANDAGES/DRESSINGS) ×1
CLOTH BEACON ORANGE TIMEOUT ST (SAFETY) ×3 IMPLANT
DRSG OPSITE POSTOP 4X10 (GAUZE/BANDAGES/DRESSINGS) ×3 IMPLANT
ELECT REM PT RETURN 9FT ADLT (ELECTROSURGICAL) ×3
ELECTRODE REM PT RTRN 9FT ADLT (ELECTROSURGICAL) ×1 IMPLANT
EXTRACTOR VACUUM BELL STYLE (SUCTIONS) IMPLANT
EXTRACTOR VACUUM KIWI (MISCELLANEOUS) IMPLANT
GLOVE BIO SURGEON STRL SZ 6.5 (GLOVE) ×2 IMPLANT
GLOVE BIO SURGEONS STRL SZ 6.5 (GLOVE) ×1
GLOVE BIOGEL PI IND STRL 7.0 (GLOVE) ×2 IMPLANT
GLOVE BIOGEL PI INDICATOR 7.0 (GLOVE) ×4
GOWN STRL REUS W/TWL LRG LVL3 (GOWN DISPOSABLE) ×9 IMPLANT
KIT ABG SYR 3ML LUER SLIP (SYRINGE) IMPLANT
NEEDLE HYPO 22GX1.5 SAFETY (NEEDLE) IMPLANT
NEEDLE HYPO 25X5/8 SAFETYGLIDE (NEEDLE) IMPLANT
NS IRRIG 1000ML POUR BTL (IV SOLUTION) ×3 IMPLANT
PACK C SECTION WH (CUSTOM PROCEDURE TRAY) ×3 IMPLANT
PAD OB MATERNITY 4.3X12.25 (PERSONAL CARE ITEMS) ×3 IMPLANT
PENCIL SMOKE EVAC W/HOLSTER (ELECTROSURGICAL) ×3 IMPLANT
RTRCTR C-SECT PINK 25CM LRG (MISCELLANEOUS) ×3 IMPLANT
SPONGE LAP 18X18 X RAY DECT (DISPOSABLE) ×2 IMPLANT
STRIP CLOSURE SKIN 1/2X4 (GAUZE/BANDAGES/DRESSINGS) ×2 IMPLANT
SUT CHROMIC 2 0 CT 1 (SUTURE) ×6 IMPLANT
SUT MNCRL 0 VIOLET CTX 36 (SUTURE) ×2 IMPLANT
SUT MONOCRYL 0 CTX 36 (SUTURE) ×4
SUT PDS AB 0 CTX 36 PDP370T (SUTURE) IMPLANT
SUT PLAIN 2 0 (SUTURE) ×3
SUT PLAIN ABS 2-0 CT1 27XMFL (SUTURE) IMPLANT
SUT VIC AB 0 CTX 36 (SUTURE) ×6
SUT VIC AB 0 CTX36XBRD ANBCTRL (SUTURE) ×2 IMPLANT
SUT VIC AB 4-0 KS 27 (SUTURE) ×3 IMPLANT
SYR CONTROL 10ML LL (SYRINGE) IMPLANT
TOWEL OR 17X24 6PK STRL BLUE (TOWEL DISPOSABLE) ×3 IMPLANT
TRAY FOLEY CATH SILVER 14FR (SET/KITS/TRAYS/PACK) ×3 IMPLANT

## 2016-10-21 NOTE — H&P (Signed)
Heather Murphy is a 25 y.o. female presenting for regular painful contractions and blood show.  She denies leaking of fluid, notes mild HA.  Normal FM  OB History    Gravida Para Term Preterm AB Living   1             SAB TAB Ectopic Multiple Live Births                 Past Medical History:  Diagnosis Date  . IBS (irritable bowel syndrome)    Past Surgical History:  Procedure Laterality Date  . NO PAST SURGERIES     Family History: family history is not on file. Social History:  reports that she has never smoked. She has never used smokeless tobacco. She reports that she does not drink alcohol or use drugs.     Maternal Diabetes: No Genetic Screening: Normal NIPT XY Maternal Ultrasounds/Referrals: Normal Fetal Ultrasounds or other Referrals:  Other: Normal Anatomy scan Maternal Substance Abuse:  No Significant Maternal Medications:  None Significant Maternal Lab Results:  Lab values include: Group B Strep positive Other Comments:  None  Review of Systems  Constitutional: Negative for chills and fever.  Eyes: Negative for blurred vision and double vision.  Respiratory: Negative for cough.   Cardiovascular: Negative for chest pain and palpitations.  Gastrointestinal: Negative for heartburn and nausea.  Genitourinary: Negative for dysuria.  Musculoskeletal: Negative for myalgias and neck pain.  Neurological: Positive for headaches.   Maternal Medical History:  Reason for admission: Contractions.  Nausea.  Contractions: Onset was 6-12 hours ago.   Frequency: regular.   Duration is approximately 60 seconds.   Perceived severity is strong.    Fetal activity: Perceived fetal activity is normal.   Last perceived fetal movement was within the past 12 hours.    Prenatal complications: Late entry to prenatal care at 28 weeks  Prenatal Complications - Diabetes: none.    Dilation: 8.5 Effacement (%): 100 Station: 0 Exam by:: dr Acadia Thammavong Blood pressure (!) 115/49, pulse  (!) 107, temperature 99.5 F (37.5 C), temperature source Oral, resp. rate 16, height 5\' 5"  (1.651 m), weight 111.6 kg (246 lb), SpO2 99 %. Maternal Exam:  Uterine Assessment: Contraction strength is firm.  Contraction duration is 60 seconds. Contraction frequency is regular.   Abdomen: Patient reports no abdominal tenderness. Fundal height is 39 cm.   Estimated fetal weight is 3000 grams.   Fetal presentation: vertex  Introitus: Normal vulva. Normal vagina.  Ferning test: not done.  Amniotic fluid character: not assessed.  Cervix: Cervix evaluated by digital exam.   On Admission in MAU: 4/90/-1  Fetal Exam Fetal Monitor Review: Baseline rate: on admission:140.  Variability: moderate (6-25 bpm).   Pattern: accelerations present and no decelerations.    Fetal State Assessment: Category I - tracings are normal.     Physical Exam  Vitals reviewed. Constitutional: She is oriented to person, place, and time. She appears well-developed and well-nourished.  HENT:  Head: Normocephalic.  Neurological: She is alert and oriented to person, place, and time.    Prenatal labs: ABO, Rh: --/--/AB POS (10/21 1858) Antibody: NEG (10/21 1858) Rubella: Immune (08/11 0000) RPR: Nonreactive (08/11 0000)  HBsAg: Negative (08/11 0000)  HIV: Non-reactive (08/11 0000)  GBS: Positive (10/21 0000)   Assessment/Plan: 25 yo G3P0020 at 38 week 6 days in early active labor Admit to L&D Continuous monitoring.  PIH labs drawn previously in MAU negative Epidural  Active management of labor   Hertha Gergen,  Viviane Semidey STACIA 10/21/2016, 2:08 AM

## 2016-10-21 NOTE — Progress Notes (Signed)
Preoperative Note  Fetal Heart Tracing with recurrent late decelerations.  Although there is moderate variability  Discussed with patient that the head is still quite high in the pelvis and she is 9cm and baby  May not tolerate pushing in the second stage of labor. There is also now e/o Intraamniotic Infection. As this is not an indication for c-section, it compounds the problem.    Discussed with patient risks of surgery to include, bleeding, injury to the uterus including other organs. She voiced understanding and agrees to proceed with a primary LTCS.   Reynard Christoffersen STACIA

## 2016-10-21 NOTE — Lactation Note (Addendum)
This note was copied from a baby's chart. Lactation Consultation Note  Patient Name: Heather Murphy AVWUJ'WToday's Date: 10/21/2016 Reason for consult: Initial assessment;NICU baby   Initial assessment with first time mom of 39 week infant in NICU for r/o sepsis and 02 requirements. Infant is doing better per parents. [facility].   Mom wishes to BF, DEBP set up with instructions for cleaning, set up, pumping on Initiate setting, assembling, disassembling of pump parts. Providing Milk for Your Baby in NICU Booklet given, Reviewed pumping and what to expect, supply and demand and milk coming to volume and breastmilk storage for NICU infant. . Discussed importance pf pumping every 2-3 hours with DEBP on Initiate setting followed by hand expression. Enc mom to take a 6 hour stretch at night with no pumping for rest. Mom was shown how to hand express and returned demonstration. Mom with large pendulous compressible breasts. Left areola is thick and nipple flattens with compression, right areola is compressible with an everted nipple. Glistening of colostrum noted to right nipple. Nipple care reviewed to use EBM first followed by Coconut or Olive oil.   PGM was in room and was very negative about BF, FOB asked his mother to leave. Reviewed benefits of BF with family, mom reports she is still planning to BF her infant. She reports she does not want infant to get formula, discussed that we need to try and get her milk in and she will need to discuss with NICU, mom voiced she has spoken with Dr. Mikle Boswortharlos this am.   Colostrum collection containers, yellow stickers, bottles given. Dad to ask for EBM labels in NICU. [facility] given, mom informed of IP/OP Services, BF Support Groups and LC phone #. Enc mom to call with questions/concerns prn. Mom has a Medela PIS at home and an Organic pump. Discussed pumps in NICU with mom and enc her to pump after visiting infant. Follow up tomorrow and  PRN.    Maternal Data Formula Feeding for Exclusion: No Has patient been taught Hand Expression?: Yes Does the patient have breastfeeding experience prior to this delivery?: No  Feeding    LATCH Score/Interventions                      Lactation Tools Discussed/Used WIC Program: No Pump Review: Setup, frequency, and cleaning;Milk Storage Initiated by:: Noralee StainSharon Hice, RN, IBCLC Date initiated:: 10/21/16   Consult Status Consult Status: Follow-up Date: 10/22/16 Follow-up type: In-patient    Silas FloodSharon S Hice 10/21/2016, 10:39 AM

## 2016-10-21 NOTE — Addendum Note (Signed)
Addendum  created 10/21/16 1521 by Renford DillsJanet L Neal Trulson, CRNA   Sign clinical note

## 2016-10-21 NOTE — Anesthesia Postprocedure Evaluation (Signed)
Anesthesia Post Note  Patient: Heather Murphy  Procedure(s) Performed: Procedure(s) (LRB): CESAREAN SECTION (N/A)  Patient location during evaluation: [facility] Anesthesia Type: Epidural Level of consciousness: awake Pain management: pain level controlled Vital Signs Assessment: post-procedure vital signs reviewed and stable Respiratory status: spontaneous breathing Cardiovascular status: stable Postop Assessment: no headache, no backache, epidural receding, patient able to bend at knees, no signs of nausea or vomiting and adequate PO intake Anesthetic complications: no     Last Vitals:  Vitals:   10/21/16 1208 10/21/16 1430  BP: 112/70 105/71  Pulse: (!) 104 87  Resp: 18 18  Temp: 36.8 C 36.7 C    Last Pain:  Vitals:   10/21/16 1430  TempSrc: Oral  PainSc:    Pain Goal: Patients Stated Pain Goal: 4 (10/21/16 0715)               Fanny DanceMULLINS,[name]

## 2016-10-21 NOTE — Anesthesia Postprocedure Evaluation (Signed)
Anesthesia Post Note  Patient: Heather Murphy  Procedure(s) Performed: Procedure(s) (LRB): CESAREAN SECTION (N/A)  Patient location during evaluation: PACU Anesthesia Type: Epidural Level of consciousness: awake, awake and alert and oriented Pain management: pain level controlled Vital Signs Assessment: post-procedure vital signs reviewed and stable Respiratory status: spontaneous breathing, nonlabored ventilation and respiratory function stable Cardiovascular status: stable Postop Assessment: no headache, no backache, epidural receding, patient able to bend at knees and no signs of nausea or vomiting Anesthetic complications: no     Last Vitals:  Vitals:   10/21/16 0530 10/21/16 0545  BP: 119/68 124/71  Pulse: 94 99  Resp: (!) 22 (!) 24  Temp:      Last Pain:  Vitals:   10/21/16 0545  TempSrc:   PainSc: 0-No pain   Pain Goal:    LLE Motor Response: Purposeful movement (10/21/16 0545) LLE Sensation: Numbness;Tingling (10/21/16 0545) RLE Motor Response: Purposeful movement (10/21/16 0545) RLE Sensation: Numbness;Tingling (10/21/16 0545)      Cecile HearingStephen Edward Turk

## 2016-10-21 NOTE — Transfer of Care (Signed)
Immediate Anesthesia Transfer of Care Note  Patient: Heather Murphy  Procedure(s) Performed: Procedure(s): CESAREAN SECTION (N/A)  Patient Location: PACU  Anesthesia Type:Epidural  Level of Consciousness: awake  Airway & Oxygen Therapy: Patient Spontanous Breathing  Post-op Assessment: Report given to RN and Post -op Vital signs reviewed and stable  Post vital signs: stable  Last Vitals:  Vitals:   10/21/16 0334 10/21/16 0336  BP: 134/73   Pulse: (!) 143 (!) 131  Resp:    Temp:      Last Pain:  Vitals:   10/21/16 0230  TempSrc: Oral  PainSc:          Complications: No apparent anesthesia complications

## 2016-10-21 NOTE — Progress Notes (Signed)
Heather Murphy is a 25 y.o. G1P0 at 7756w0d by LMP admitted for active labor  Subjective: Patient comfortable with epidural  Objective: BP 132/65   Pulse (!) 125   Temp 99.5 F (37.5 C) (Oral)   Resp 16   Ht 5\' 5"  (1.651 m)   Wt 111.6 kg (246 lb)   SpO2 99%   BMI 40.94 kg/m  No intake/output data recorded. Total I/O In: -  Out: 550 [Urine:550]  FHT:  FHR: 140  bpm, variability: moderate,  accelerations:  Present,  decelerations:  Absent UC:   regular, every 3 minutes SVE:   Dilation: 8.5 Effacement (%): 100 Station: 0 Exam by:: dr Ardys Hataway AROM thick meconium  Labs: Lab Results  Component Value Date   WBC 12.7 (H) 10/20/2016   HGB 11.4 (L) 10/20/2016   HCT 33.4 (L) 10/20/2016   MCV 80.9 10/20/2016   PLT 199 10/20/2016    Assessment / Plan: 25 yo G3P0 at 39 weeks in active labor Labor progressing well spontaneously, AROM shows thick Mec and there were previous  Episodes of late decelerations that spontaneously resolved.  Discussed with patient that we will watch closely, may have Peds present at delivery.   Labor: Progressing normally Preeclampsia:  no signs or symptoms of toxicity Fetal Wellbeing:  Category II Pain Control:  Epidural I/D:  n/a Anticipated MOD:  NSVD  Heather Murphy STACIA 10/21/2016, 2:12 AM

## 2016-10-21 NOTE — Progress Notes (Signed)
Subjective: Postpartum Day 0: Cesarean Delivery Patient doing well, no complaints.    Objective: Vital signs in last 24 hours: Temp:  [98 F (36.7 C)-100.4 F (38 C)] 98.8 F (37.1 C) (10/22 1007) Pulse Rate:  [90-143] 93 (10/22 1007) Resp:  [6-24] 19 (10/22 1007) BP: (106-150)/(46-79) 118/72 (10/22 1007) SpO2:  [97 %-100 %] 98 % (10/22 1007) Weight:  [106.1 kg (234 lb)-111.6 kg (246 lb)] 111.6 kg (246 lb) (10/21 1826)  Physical Exam:  General: alert, cooperative and no distress Lochia: appropriate Uterine Fundus: firm Incision: healing well, no significant drainage, no dehiscence, no significant erythema DVT Evaluation: No evidence of DVT seen on physical exam. Negative Homan's sign. No cords or calf tenderness. Calf/Ankle edema is present.   Recent Labs  10/20/16 1134 10/21/16 0905  HGB 11.4* 9.8*  HCT 33.4* 28.9*    Assessment/Plan: Status post Cesarean section. Doing well postoperatively.  Continue current care. Ambulation D/C foley this evening.   Essie HartINN, Zarielle Cea STACIA 10/21/2016, 11:19 AM

## 2016-10-21 NOTE — Op Note (Signed)
Cesarean Section Procedure Note  Indications: 39 week SIUP with repetitive late decelerations, thick meconium.    Pre-operative Diagnosis: Non-Reassuring Fetal Heart Tracing  Post-operative Diagnosis: same  Surgeon: Wynonia HazardPINN, Linah Klapper STACIA   Assistants: None  Anesthesia: Epidural anesthesia  ASA Class: 2  Procedure Details   The patient was counseled about the risks, benefits, complications of the cesarean section. The patient concurred with the proposed plan, giving informed consent.  The site of surgery properly noted/marked. The patient was taken to Operating Room # 9, identified as Heather Murphy and the procedure verified as C-Section Delivery. A Time Out was held and the above information confirmed.  After epidural was found to adequate , the patient was placed in the dorsal supine position with a leftward tilt, draped and prepped in the usual sterile manner. A Pfannenstiel incision was made and carried down through the subcutaneous tissue to the fascia.  The fascia was incised in the midline and the fascial incision was extended laterally with Mayo scissors. The superior aspect of the fascial incision was grasped with Coker clamps x2, tented up and the rectus muscles dissected off with the bovie. The rectus was then dissected off with blunt dissection and Mayo scissors inferiorly. The rectus muscles were separated in the midline. The abdominal peritoneum was identified, tented up, entered sharply, and the incision was extended superiorly and inferiorly with good visualization of the bladder. The Alexis retractor was then deployed. The vesicouterine peritoneum was identified, tented up, entered sharply with Metzenbaum scissors, and the bladder flap was created digitally. Scalpel was then used to make a low transverse incision on the uterus which was extended laterally with  blunt dissection. The fetal vertex was identified, the head was brought out from the pelvis. A Kiwi Vacuum was placed to aid  in delivery of the vertex.  The head was then delivered easily through the uterine incision followed by the body. The A live female infant was bulb suctioned on the operative field. The baby had poor tone.  cord was clamped and cut and the infant was passed to the waiting neonatologist. Apgars 3/8/9. Cord gases (venous) obtained, then cord blood.  Placenta was then delivered spontaneously, intact and appeared infected, it was sent to Pathology. The uterus was cleared of all clot and debris. The uterus was boggy and bleeding, and anesthesia was informed to increase dose of pitocin. Uterine atony was alleviated. The uterine incision was repaired with #0 Monocryl in running locked fashion. A second imbricating suture was performed using the same suture. The middle of the incision was oozing and this was alleviated by a figure of eight suture of 2-0 chromic. The vesicouterine peritoneum was reapproximated with the same suture. Uterus, ovaries and tubes were inspected and normal. The Alexis retractor was removed. The abdominal cavity was cleared of all clot and debris. The abdominal peritoneum was reapproximated with 2-0 chromic  in a running fashion, the rectus muscles was reapproximated with #2 chromic in interrupted fashion. The fascia was closed with 0 Vicryl in a running fashion. The subcuticular layer was irrigated and all bleeders cauterized.  The subcuticular layer was closed with 2-0 plain and was injected with 0.25% Marcaine.  The skin was closed with 4-0 vicryl in a subcuticular fashion using a Mellody DanceKeith needle. The incision was dressed with benzoine, steri strips and honeycomb dressing. All sponge lap and needle counts were correct x3. Patient tolerated the procedure well and recovered in stable condition following the procedure.  Instrument, sponge, and needle counts were  correct prior the abdominal closure and at the conclusion of the case.   Findings: Live female infant, Apgars 3/89, thick meconium in  amniotic fluid, infected appearing placenta, normal uterus, bilateral tubes and ovaries  Estimated Blood Loss:         Drains: Foley catheter         Specimens: Placenta to Path, cord gases, cord blood         Implants: none         Complications:  PPH         Disposition: PACU - hemodynamically stable.   Heather Murphy STACIA

## 2016-10-22 ENCOUNTER — Encounter (HOSPITAL_COMMUNITY): Payer: Self-pay | Admitting: *Deleted

## 2016-10-22 MED ORDER — SODIUM CHLORIDE 0.9% FLUSH
3.0000 mL | Freq: Two times a day (BID) | INTRAVENOUS | Status: DC
  Administered 2016-10-22 – 2016-10-23 (×2): 3 mL via INTRAVENOUS

## 2016-10-22 NOTE — Progress Notes (Signed)
Patient is doing well.  She is tolerating PO, ambulating, voiding.  Pain is controlled.  Lochia is appropriate Is pumping.  Reports baby doing well in NICU.  Has been afebrile since delivery.  Received 4 doses of post op unasyn for intrapartum fever, possible chorio  Vitals:   10/21/16 2010 10/21/16 2227 10/22/16 0202 10/22/16 0546  BP: (!) 103/58 127/75 (!) 107/55 119/64  Pulse: 66 84 63 69  Resp:  16 17 16   Temp: 98 F (36.7 C) 99.1 F (37.3 C) 98.1 F (36.7 C) 98.3 F (36.8 C)  TempSrc: Oral Oral Oral Oral  SpO2:  100% 100% 100%  Weight:      Height:        NAD Abdomen:  soft, appropriate tenderness, incisions intact.  Moderate amt of dry heme under honeycomb in midline ext:    Symmetric, 1+ edema bilaterally, SCDs  Lab Results  Component Value Date   WBC 15.8 (H) 10/21/2016   HGB 9.8 (L) 10/21/2016   HCT 28.9 (L) 10/21/2016   MCV 80.3 10/21/2016   PLT 195 10/21/2016    --/--/AB POS (10/21 1858)/RImmune  A/P    25 y.o. G1P1001 POD 1 s/p primary c/s for NRFS at term Routine post op and postpartum care.   Will d/c IVF / abx at this time as no evidence of endometritis. Pumping

## 2016-10-22 NOTE — Progress Notes (Signed)
CSW acknowledges NICU admission.    Patient screened out for psychosocial assessment since none of the following apply:  Psychosocial stressors documented in mother or baby's chart  Gestation less than 32 weeks  Code at delivery   Infant with anomalies  Please contact the Clinical Social Worker if specific needs arise, or by MOB's request.       

## 2016-10-22 NOTE — Lactation Note (Signed)
This note was copied from a baby's chart. Lactation Consultation Note  Patient Name: Heather Murphy ZOXWR'UToday's Date: 10/22/2016 Reason for consult: Follow-up assessment;NICU baby   Follow up with mom in NICU for feeding. Infant had been trying to latch and was unable to latch. Assisted mom with feeding. Nipples are flat and areola is thick. Infant was unable to latch to right breast. # 24 NS placed and infant latched, he pushed the NS out of his mouth often. We then placed # 20 NS and infant did get on and suckled intermittently for short periods of time. Infant fed on and off for 15 minutes. No swallows noted and no milk noted in NS. No milk was able to be hand expressed before and after feed.  Mom reports she is pumping every 3 hours. Enc mom to call with questions/concerns prn. Follow up tomorrow and prn.   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 She has a Medela PIS at home.    Maternal Data Formula Feeding for Exclusion: No Has patient been taught Hand Expression?: Yes Does the patient have breastfeeding experience prior to this delivery?: No  Feeding Feeding Type: Breast Fed Nipple Type: Slow - flow Length of feed: 20 min  LATCH Score/Interventions Latch: Repeated attempts needed to sustain latch, nipple held in mouth throughout feeding, stimulation needed to elicit sucking reflex. Intervention(s): Skin to skin;Teach feeding cues;Waking techniques Intervention(s): Breast massage;Breast compression  Audible Swallowing: None  Type of Nipple: Flat  Comfort (Breast/Nipple): Soft / non-tender     Hold (Positioning): Assistance needed to correctly position infant at breast and maintain latch. Intervention(s): Breastfeeding basics reviewed;Support Pillows;Position options;Skin to skin  LATCH Score: 5  Lactation Tools Discussed/Used Tools: Nipple Shields Nipple shield size: 20 WIC Program: No Pump Review: Setup, frequency, and  cleaning;Milk Storage Initiated by:: Reviewed   Consult Status Consult Status: Follow-up Date: 10/16/16 Follow-up type: In-patient    Silas FloodSharon S Yosiel Thieme 10/22/2016, 10:15 AM

## 2016-10-23 NOTE — Lactation Note (Signed)
This note was copied from a baby's chart. Lactation Consultation Note  Patient Name: Heather Murphy AVWUJ'WToday's Date: 10/23/2016 Reason for consult: Follow-up assessment;NICU baby   Follow up with mom and infant in NICU. Infant was sleepy and difficult to awaken to feed. Fed him 20 cc formula and then latched him to left breast in the cross cradle hold. # 20 NS was used and primed with formula using curved tip syringe. Mom was wearing breast shells and nipple was much more erect. He fed for about 5 minutes and then fell asleep. Enc mom to continue BF as she is available. Infant and mom may be d/c home tomorrow. Enc mom to make OP appt for LC. Mom voiced that she would like to. Enc mom to continue reverse massage, pumping and hand expression every 2-3 hours. Mom voiced understanding. Follow up tomorrow and prn.    Maternal Data Formula Feeding for Exclusion: No Has patient been taught Hand Expression?: Yes Does the patient have breastfeeding experience prior to this delivery?: No  Feeding Feeding Type: Formula Nipple Type: Regular Length of feed: 30 min  LATCH Score/Interventions Latch: Repeated attempts needed to sustain latch, nipple held in mouth throughout feeding, stimulation needed to elicit sucking reflex. Intervention(s): Skin to skin;Teach feeding cues;Waking techniques Intervention(s): Breast massage;Breast compression  Audible Swallowing: A few with stimulation (formula in NS) Intervention(s): Hand expression;Skin to skin Intervention(s): Alternate breast massage  Type of Nipple: Flat Intervention(s): Shells  Comfort (Breast/Nipple): Soft / non-tender     Hold (Positioning): Assistance needed to correctly position infant at breast and maintain latch. Intervention(s): Breastfeeding basics reviewed;Support Pillows;Position options;Skin to skin  LATCH Score: 6  Lactation Tools Discussed/Used Tools: Nipple Shields Nipple shield size: 20 WIC Program: No Pump Review:  Setup, frequency, and cleaning   Consult Status Consult Status: Follow-up Date: 10/24/16 Follow-up type: In-patient    Silas FloodSharon S Nakeita Styles 10/23/2016, 6:26 PM

## 2016-10-23 NOTE — Progress Notes (Signed)
I spent time with Heather Murphy as well as her mother and sister.  They are still emotionally processing all that happened surrounding her baby's delivery and his admission to the NICU.  Heather Murphy has support from many people and she also has a strong faith and supportive faith community.  They requested prayer both in the room and at baby's bedside.  Heather Murphy is very hopeful that her baby will be with her soon and that she may be able to discharge together with her son.   Chaplain Dyanne CarrelKaty Venessa Murphy, Bcc Pager, [phone] 3:59 PM    10/23/16 1500  Clinical Encounter Type  Visited With Patient;Family;Patient and family together  Visit Type Initial;Spiritual support  Spiritual Encounters  Spiritual Needs Prayer;Emotional

## 2016-10-23 NOTE — Lactation Note (Signed)
This note was copied from a baby's chart. Lactation Consultation Note  Patient Name: Heather Murphy VWUJW'JToday's Date: 10/23/2016 Reason for consult: Follow-up assessment;NICU baby   Follow up with mom of 4859 hour old NICU infant. Mom reports she is pumping and hand expressing every 3 hours and not seeing volume yet. Mom reports her nipples have changed today, mom noted to have areolar edema. Laid mom back and showed her reverse pressure, mom assisted in reverse pressure. After reverse pressure she was noted to have a gtt of colostrum from left nipple, mom pleased. Breast shells given with instructions to wear during the day between pumping/feeding and how to clean them. Will follow up with mom in NICU for next feeding for feeding assessment.    Maternal Data Formula Feeding for Exclusion: No Has patient been taught Hand Expression?: Yes Does the patient have breastfeeding experience prior to this delivery?: No  Feeding Feeding Type: Formula Nipple Type: Slow - flow Length of feed: 20 min  LATCH Score/Interventions                      Lactation Tools Discussed/Used WIC Program: No Pump Review: Setup, frequency, and cleaning;Milk Storage   Consult Status Consult Status: Follow-up Date: 10/23/16 Follow-up type: In-patient    Silas FloodSharon S Caymen Dubray 10/23/2016, 4:05 PM

## 2016-10-23 NOTE — Progress Notes (Signed)
Patient is eating, ambulating, voiding.  Pain control is good.  Appropriate lochia, no complaints.  Vitals:   10/22/16 1830 10/22/16 2154 10/23/16 0549 10/23/16 1314  BP: 112/76 123/82 125/76 119/64  Pulse: 98 81 75 81  Resp: 14 16 14 16   Temp: 98.4 F (36.9 C) 98.9 F (37.2 C) 98.1 F (36.7 C) 98.8 F (37.1 C)  TempSrc: Oral Oral  Oral  SpO2: 99% 99% 100% 100%  Weight:      Height:        Fundus firm Perineum without swelling. Inc: c/d/i Ext: no CT  Lab Results  Component Value Date   WBC 15.8 (H) 10/21/2016   HGB 9.8 (L) 10/21/2016   HCT 28.9 (L) 10/21/2016   MCV 80.3 10/21/2016   PLT 195 10/21/2016    --/--/AB POS (10/21 1858)  A/P Post op day #2 Doing well.  Routine care.    Philip AspenALLAHAN, Jarae Nemmers

## 2016-10-24 MED ORDER — IBUPROFEN 600 MG PO TABS: 600.0000 mg | ORAL_TABLET | Freq: Four times a day (QID) | ORAL | 3 refills | Status: DC | PRN

## 2016-10-24 MED ORDER — OXYCODONE-ACETAMINOPHEN 5-325 MG PO TABS: 1.0000 | ORAL_TABLET | ORAL | 0 refills | Status: DC | PRN

## 2016-10-24 NOTE — Progress Notes (Signed)
Discharge teaching complete. Pt understood all information and did not have any questions. Pt ambulated to 2nd floor and will room in with baby.

## 2016-10-24 NOTE — Progress Notes (Signed)
Subjective: Postpartum Day 3: Cesarean Delivery Patient reports tolerating PO, + flatus, + BM and no problems voiding.    Objective: Vital signs in last 24 hours: Temp:  [98 F (36.7 C)-98.9 F (37.2 C)] 98 F (36.7 C) (10/25 1300) Pulse Rate:  [66-74] 74 (10/25 1300) Resp:  [14-16] 16 (10/25 1300) BP: (118-131)/(63-73) 131/70 (10/25 1300) SpO2:  [99 %-100 %] 99 % (10/25 1300)  Physical Exam:  General: alert, cooperative and no distress Lochia: appropriate Uterine Fundus: firm Incision: healing well, no significant drainage, no dehiscence, no significant erythema DVT Evaluation: No evidence of DVT seen on physical exam. Negative Homan's sign. No cords or calf tenderness. No significant calf/ankle edema.  No results for input(s): HGB, HCT in the last 72 hours.  Assessment/Plan: Status post Cesarean section. Doing well postoperatively.  Discharge home with standard precautions and return to clinic in 4-6 weeks.  Essie HartINN, Kerith Sherley STACIA 10/24/2016, 2:00 PM

## 2016-10-24 NOTE — Discharge Summary (Signed)
Obstetric Discharge Summary Reason for Admission: onset of labor Prenatal Procedures: NST and ultrasound Intrapartum Procedures: cesarean: low cervical, transverse Postpartum Procedures: none Complications-Operative and Postpartum: none Hemoglobin  Date Value Ref Range Status  10/21/2016 9.8 (L) 12.0 - 15.0 g/dL Final   HCT  Date Value Ref Range Status  10/21/2016 28.9 (L) 36.0 - 46.0 % Final    Physical Exam:  General: alert, cooperative and no distress Lochia: appropriate Uterine Fundus: firm Incision: healing well, no significant drainage, no dehiscence, no significant erythema DVT Evaluation: No evidence of DVT seen on physical exam. Negative Homan's sign. No cords or calf tenderness. No significant calf/ankle edema.  Discharge Diagnoses: Term Pregnancy-delivered  Discharge Information: Date: 10/24/2016 Activity: pelvic rest Diet: routine Medications: Ibuprofen and Percocet Condition: stable Instructions: refer to practice specific booklet Discharge to: home   Newborn Data: Live born female  Birth Weight: 6 lb 11.9 oz (3060 g) APGAR: 3, 8  In NICU  Othmar Ringer STACIA 10/24/2016, 2:05 PM

## 2016-10-25 ENCOUNTER — Ambulatory Visit: Payer: Self-pay

## 2016-10-25 NOTE — Lactation Note (Signed)
This note was copied from a baby's chart. Lactation Consultation Note  Patient Name: Heather Murphy Today's Date: 10/25/2016  Met with parents in the rooming in room.  Mom states baby latched well last night using football hold.  Her milk is in and she pumped 45 mls this AM.  Reviewed breast milk storage.  Mom knows to pump every 3 hours to establish and maintain a milk supply.  Mom declined feeding assist this AM.  She will call us if lactation outpatient appointment desired.   Maternal Data    Feeding    LATCH Score/Interventions                      Lactation Tools Discussed/Used     Consult Status      MOULDEN, LAURA S 10/25/2016, 11:22 AM    

## 2016-11-21 ENCOUNTER — Other Ambulatory Visit: Payer: Self-pay | Admitting: Obstetrics & Gynecology

## 2016-12-09 ENCOUNTER — Encounter (HOSPITAL_COMMUNITY): Payer: Self-pay | Admitting: Certified Nurse Midwife

## 2016-12-09 ENCOUNTER — Inpatient Hospital Stay (HOSPITAL_COMMUNITY)
Admission: AD | Admit: 2016-12-09 | Discharge: 2016-12-09 | Disposition: A | Discharge status: Home / Self Care | Payer: 59 | Source: Ambulatory Visit | Attending: Obstetrics | Admitting: Obstetrics

## 2016-12-09 DIAGNOSIS — N61 Mastitis without abscess: Secondary | ICD-10-CM | POA: Insufficient documentation

## 2016-12-09 DIAGNOSIS — O9089 Other complications of the puerperium, not elsewhere classified: Secondary | ICD-10-CM | POA: Diagnosis not present

## 2016-12-09 DIAGNOSIS — N644 Mastodynia: Secondary | ICD-10-CM | POA: Insufficient documentation

## 2016-12-09 DIAGNOSIS — O9279 Other disorders of lactation: Secondary | ICD-10-CM | POA: Diagnosis not present

## 2016-12-09 DIAGNOSIS — K589 Irritable bowel syndrome without diarrhea: Secondary | ICD-10-CM | POA: Diagnosis not present

## 2016-12-09 LAB — URINALYSIS, MICROSCOPIC (REFLEX)

## 2016-12-09 LAB — POCT PREGNANCY, URINE: PREG TEST UR: NEGATIVE

## 2016-12-09 LAB — URINALYSIS, ROUTINE W REFLEX MICROSCOPIC
BILIRUBIN URINE: NEGATIVE
Glucose, UA: NEGATIVE mg/dL
Ketones, ur: NEGATIVE mg/dL
Leukocytes, UA: NEGATIVE
NITRITE: NEGATIVE
PROTEIN: NEGATIVE mg/dL
SPECIFIC GRAVITY, URINE: 1.025 (ref 1.005–1.030)
pH: 5.5 (ref 5.0–8.0)

## 2016-12-09 MED ORDER — DICLOXACILLIN SODIUM 500 MG PO CAPS: 500.0000 mg | ORAL_CAPSULE | Freq: Four times a day (QID) | ORAL | 0 refills | Status: DC

## 2016-12-09 NOTE — MAU Note (Signed)
Patient presents to MAU with complaints of breast pain. Patient states breast and nipples are painful and ache. She continues to pump but struggles to latch baby since birth. She states nipples are cracked and red. Patient took temp at home and was 103.4

## 2016-12-09 NOTE — Discharge Instructions (Signed)
Mastitis °Mastitis is redness, soreness, and puffiness (inflammation) in an area of the breast. It is often caused by an infection that occurs when bacteria enter the skin. The infection is often helped by antibiotic medicine. °Follow these instructions at home: °· Only take medicines as told by your doctor. °· If your doctor prescribed an antibiotic medicine, take it as told. Finish it even if you start to feel better. °· Do not wear a tight or underwire bra. Wear a soft support bra. °· Drink more fluids, especially if you have a fever. °· If you are breastfeeding: °? Keep emptying the breast. Your doctor can tell you if the milk is safe. Use a breast pump if you are told to stop nursing. °? Keep your nipples clean and dry. °? Empty the first breast before going to the other breast. Use a breast pump if your baby is not emptying your breast. °? If you go back to work, pump your breasts while at work. °? Avoid letting your breasts get overly filled with milk (engorged). °Contact a doctor if: °· You have pus-like fluid leaking from your breast. °· Your symptoms do not get better within 2 days. °Get help right away if: °· Your pain and puffiness are getting worse. °· Your pain is not helped by medicine. °· You have a red line going from your breast toward your armpit. °· You have a fever or lasting symptoms for more than 2-3 days. °· You have a fever and your symptoms suddenly get worse. °This information is not intended to replace advice given to you by your health care provider. Make sure you discuss any questions you have with your health care provider. °Document Released: 12/05/2009 Document Revised: 05/24/2016 Document Reviewed: 07/17/2013 °Elsevier Interactive Patient Education © 2017 [employer] ° °

## 2016-12-09 NOTE — MAU Provider Note (Signed)
History     CSN: [id number]  Arrival date and time: 12/09/16 [id number]   First Provider Initiated Contact with Patient 12/09/16 1931      Chief Complaint  Patient presents with  . Breast Pain   HPI [name] is a 25 y.o. G1P1001 who is 7 weeks PP who presents to MAU today with complaint of breast pain. The patient has been exclusively pumping since 1-2 weeks PP. She states that she only pumps 2-3 times ina 24 hour period. She states that she often has breast pain, but it was worse today. She noted fever of 100.3 F at home tonight. She took Ibuprofen ~ 2 hours ago.    OB History    Gravida Para Term Preterm AB Living   1 1 1     1    SAB TAB Ectopic Multiple Live Births         0 1      Past Medical History:  Diagnosis Date  . IBS (irritable bowel syndrome)     Past Surgical History:  Procedure Laterality Date  . CESAREAN SECTION N/A 10/21/2016   Procedure: CESAREAN SECTION;  Surgeon: Essie HartWalda Pinn, MD;  [facility];  Service: Obstetrics;  Laterality: N/A;  . NO PAST SURGERIES      Family History  Problem Relation Age of Onset  . Cancer Neg Hx   . Diabetes Neg Hx   . Hypertension Neg Hx     Social History  Substance Use Topics  . Smoking status: Never Smoker  . Smokeless tobacco: Never Used  . Alcohol use No    Allergies:  Allergies  Allergen Reactions  . Food Anaphylaxis and Other (See Comments)    Pt states that she is allergic to peanut butter.   . Peanut-Containing Drug Products Anaphylaxis    No prescriptions prior to admission.    Review of Systems  Constitutional: Positive for fever. Negative for malaise/fatigue.  Genitourinary:       + breast pain  Skin: Positive for rash.   Physical Exam   Blood pressure 117/63, pulse 92, temperature 98.9 F (37.2 C), temperature source Oral, resp. rate 16, unknown if currently breastfeeding.  Physical Exam  Nursing note and vitals reviewed. Constitutional: She is oriented to person,  place, and time. She appears well-developed and well-nourished. No distress.  HENT:  Head: Normocephalic and atraumatic.  Cardiovascular: Normal rate.   Respiratory: Effort normal.    GI: Soft. She exhibits no distension.  Neurological: She is alert and oriented to person, place, and time.  Skin: Skin is warm and dry. No erythema.  Psychiatric: She has a normal mood and affect.    Results for orders placed or performed during the hospital encounter of 12/09/16 (from the past 24 hour(s))  Urinalysis, Routine w reflex microscopic     Status: Abnormal   Collection Time: 12/09/16  7:27 PM  Result Value Ref Range   Color, Urine YELLOW YELLOW   APPearance CLEAR CLEAR   Specific Gravity, Urine 1.025 1.005 - 1.030   pH 5.5 5.0 - 8.0   Glucose, UA NEGATIVE NEGATIVE mg/dL   Hgb urine dipstick LARGE (A) NEGATIVE   Bilirubin Urine NEGATIVE NEGATIVE   Ketones, ur NEGATIVE NEGATIVE mg/dL   Protein, ur NEGATIVE NEGATIVE mg/dL   Nitrite NEGATIVE NEGATIVE   Leukocytes, UA NEGATIVE NEGATIVE  Urinalysis, Microscopic (reflex)     Status: Abnormal   Collection Time: 12/09/16  7:27 PM  Result Value Ref Range   RBC /  HPF 6-30 0 - 5 RBC/hpf   WBC, UA 0-5 0 - 5 WBC/hpf   Bacteria, UA FEW (A) NONE SEEN   Squamous Epithelial / LPF 6-30 (A) NONE SEEN   Urine-Other MUCOUS PRESENT   Pregnancy, urine POC     Status: None   Collection Time: 12/09/16  7:29 PM  Result Value Ref Range   Preg Test, Ur NEGATIVE NEGATIVE    MAU Course  Procedures None  MDM Discussed patient with Dr. Chestine Sporelark. Advised Rx for Dicloxacillin, Ibuprofen scheduled and increased pumping regimen.  Discussed at length with patient pumping schedule and appropriate use of nipple butter and pump settings  Assessment and Plan  A: Postpartum Mastitis Engorgement   P: Discharge home Rx for Dicloxacillin given to patient  Ibuprofen scheduled recommended Warning signs for worsening condition discussed Patient [facility] GYN care Patient may return to MAU as needed or if her condition were to change or worsen   Marny LowensteinJulie N Alauna Hayden, PA-C  12/09/2016, 8:46 PM

## 2017-05-17 DIAGNOSIS — R234 Changes in skin texture: Secondary | ICD-10-CM | POA: Diagnosis not present

## 2017-05-17 DIAGNOSIS — K5901 Slow transit constipation: Secondary | ICD-10-CM | POA: Diagnosis not present

## 2017-06-28 DIAGNOSIS — Z0000 Encounter for general adult medical examination without abnormal findings: Secondary | ICD-10-CM | POA: Diagnosis not present

## 2017-06-28 DIAGNOSIS — Z13220 Encounter for screening for lipoid disorders: Secondary | ICD-10-CM | POA: Diagnosis not present

## 2017-06-28 DIAGNOSIS — E669 Obesity, unspecified: Secondary | ICD-10-CM | POA: Diagnosis not present

## 2017-08-13 IMAGING — CT CT ABD-PELV W/ CM
2 of 6 series · 15 of 46 positions shown, 19 images · IV contrast (OMNIPAQUE)
Comparison: None.

CLINICAL DATA: Abdominal pain concerning for appendicitis.

EXAM:
CT ABDOMEN AND PELVIS WITH CONTRAST
TECHNIQUE: Multidetector CT imaging of the abdomen and pelvis was performed
using the standard protocol following bolus administration of
intravenous contrast.
CONTRAST:  100mL 2HK4MZ-5MM IOPAMIDOL (2HK4MZ-5MM) INJECTION 61%

[Series 3: (person_name) thins · axial · 0.68mm/px · z∈[-457,-35]mm · 12 of 683 slices shown, 16 images]
[im 53/683  soft-tissue]
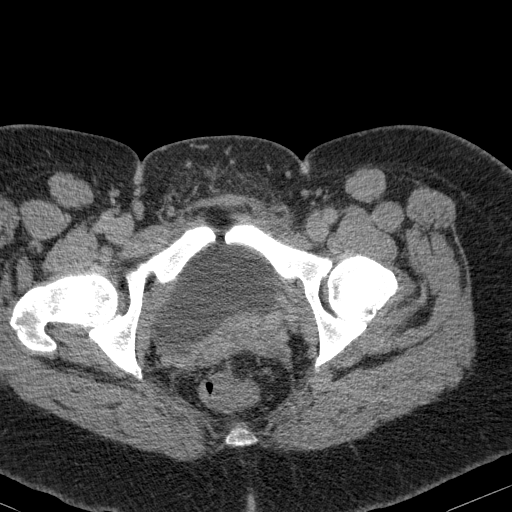
[im 53/683  bone]
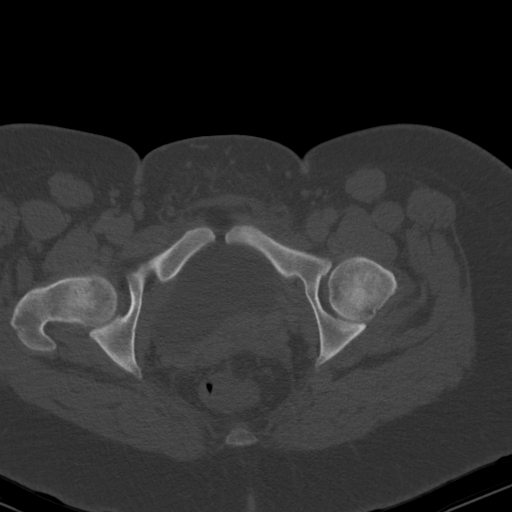
[im 105/683  soft-tissue]
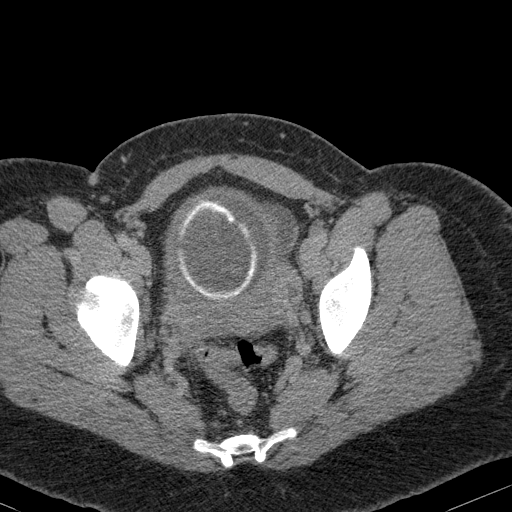
[im 184/683  soft-tissue]
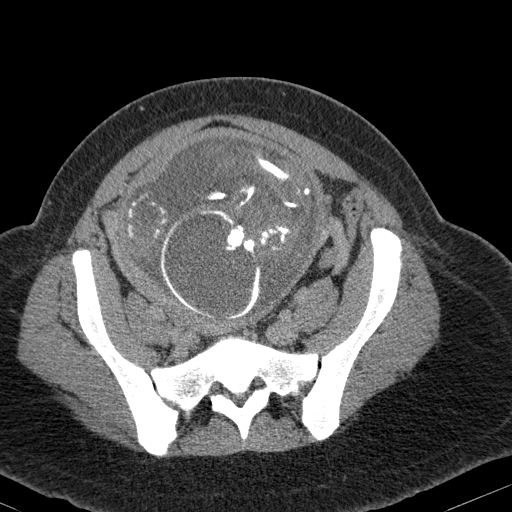
[im 237/683  soft-tissue]
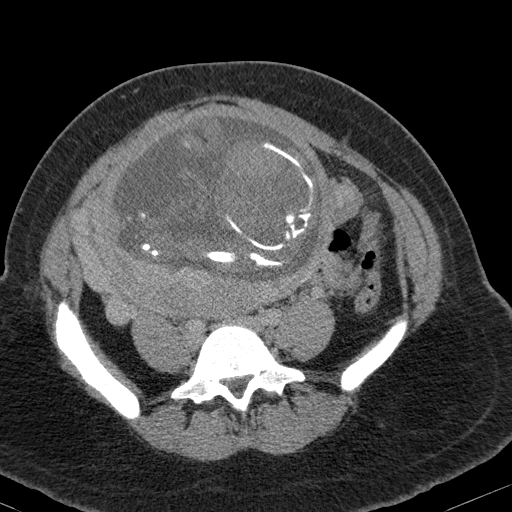
[im 315/683  soft-tissue]
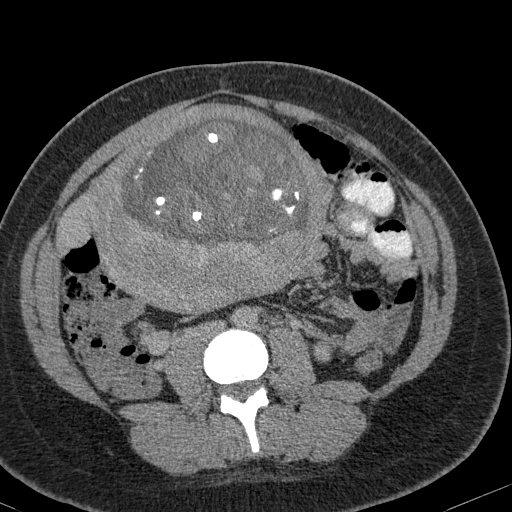
[im 368/683  soft-tissue]
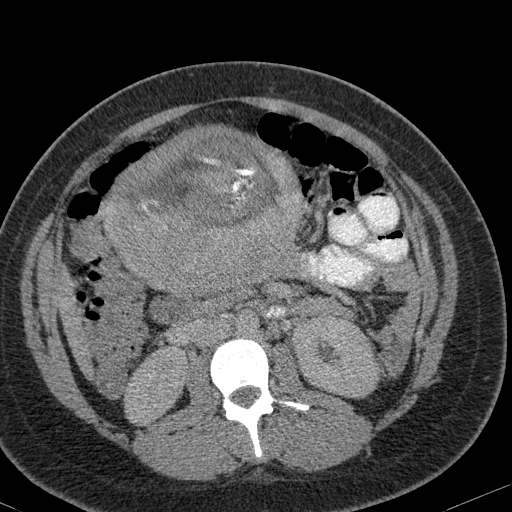
[im 446/683  soft-tissue]
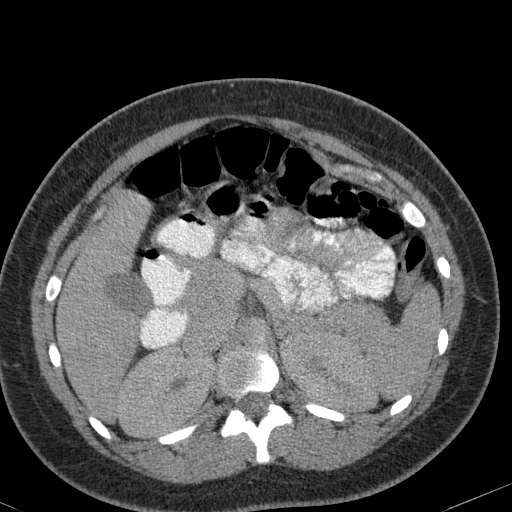
[im 499/683  soft-tissue]
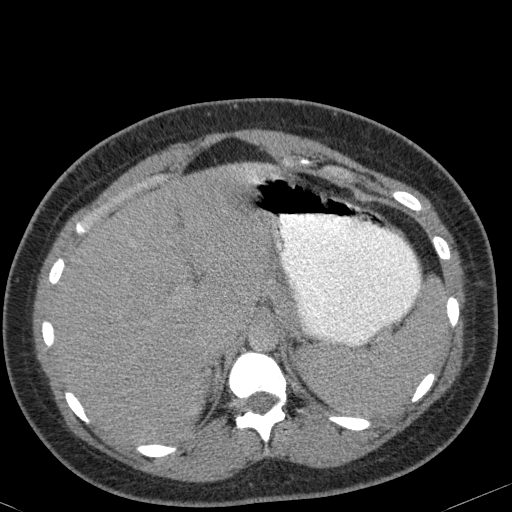
[im 578/683  soft-tissue]
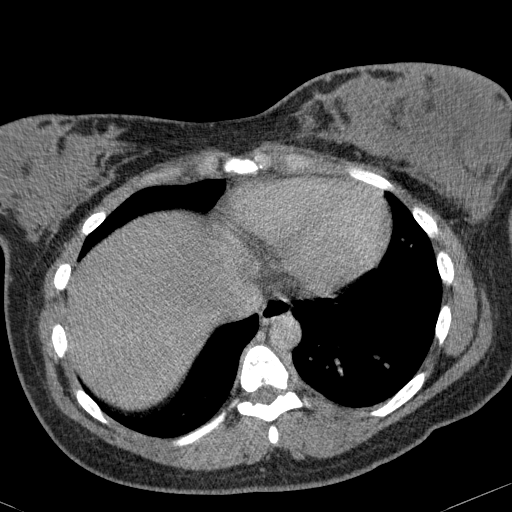
[im 578/683  lung]
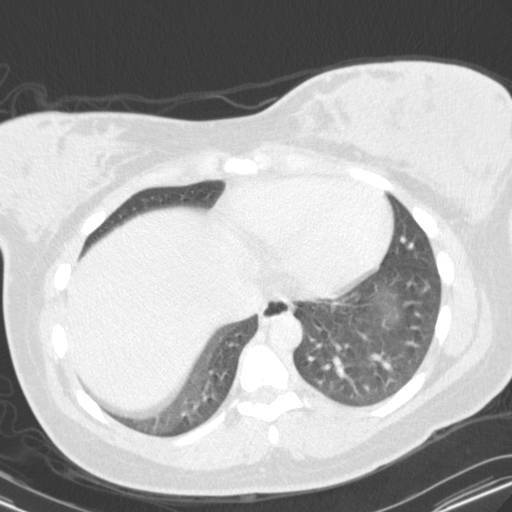
[im 578/683  bone]
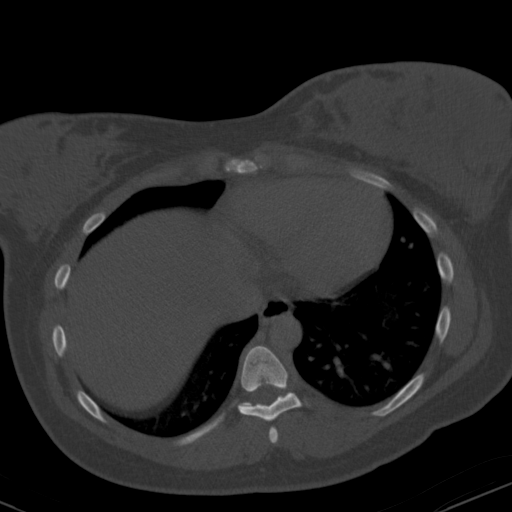
[im 604/683  lung]
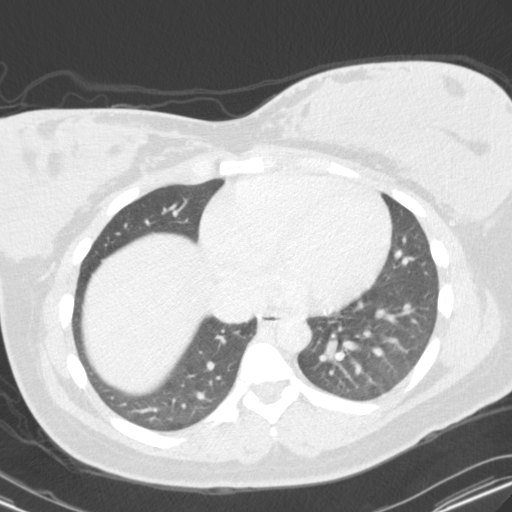
[im 630/683  soft-tissue]
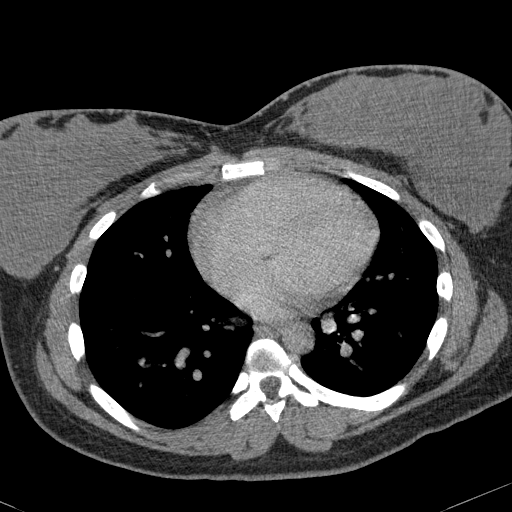
[im 630/683  lung]
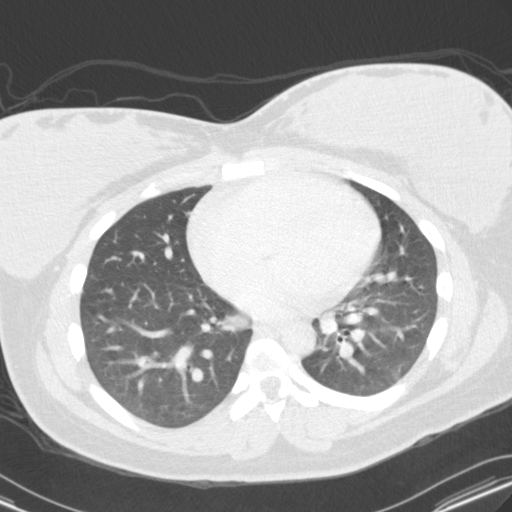
[im 656/683  lung]
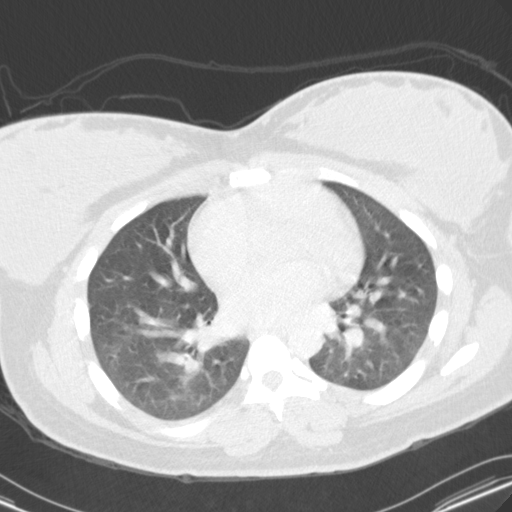

[Series 602: <mpr thick range> · coronal · 0.94mm/px · 3 of 162 slices shown]
[im 54/162  soft-tissue]
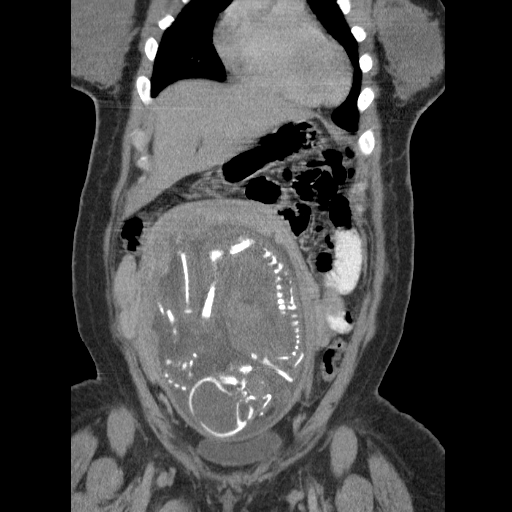
[im 72/162  soft-tissue]
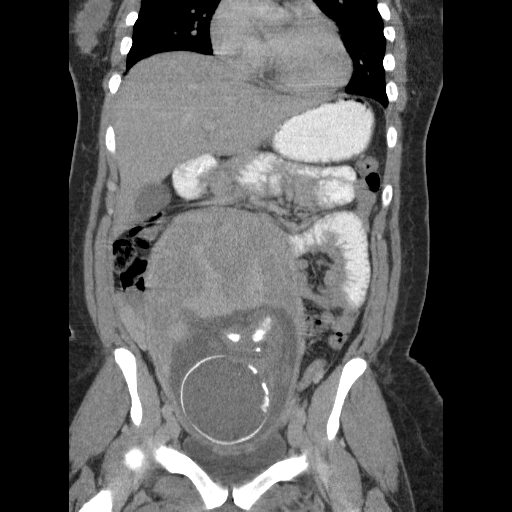
[im 90/162  soft-tissue]
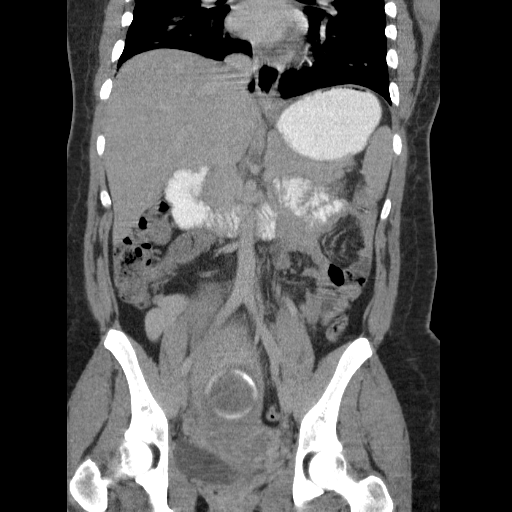

[15 of 46 positions shown; findings below may reference images not displayed]

FINDINGS: Lower chest and abdominal wall:  Negative for pneumonia.

Hepatobiliary: No focal liver abnormality.No calcified gallstone or
signs of acute appendicitis.

Pancreas: Unremarkable.

Spleen: Unremarkable.

Adrenals/Urinary Tract: Negative adrenals. Symmetric renal
enhancement. No indication of pyelonephritis. Symmetric mild intra
renal collecting system prominence, normal in this setting.
Unremarkable bladder.

Stomach/Bowel: No obstruction. The appendix is visible and
unremarkable.

Reproductive:Gravid uterus. No indication of previa or subplacental
collection. No noted fetal anomaly, but CT is not a substitute for
sonography.

Vascular/Lymphatic: No acute vascular abnormality. No mass or
adenopathy.

Other: No ascites or pneumoperitoneum.

Musculoskeletal: Negative.
IMPRESSION: Negative.  No appendicitis or other explanation for pain.

## 2017-08-13 IMAGING — CR DG ABDOMEN 1V
1 series · 1 of 1 positions shown · non-contrast
Comparison: None.

CLINICAL DATA: Thirty weeks pregnant, lower abdominal pain,
vomiting, fever

EXAM:
ABDOMEN - 1 VIEW

[abdomen kub]
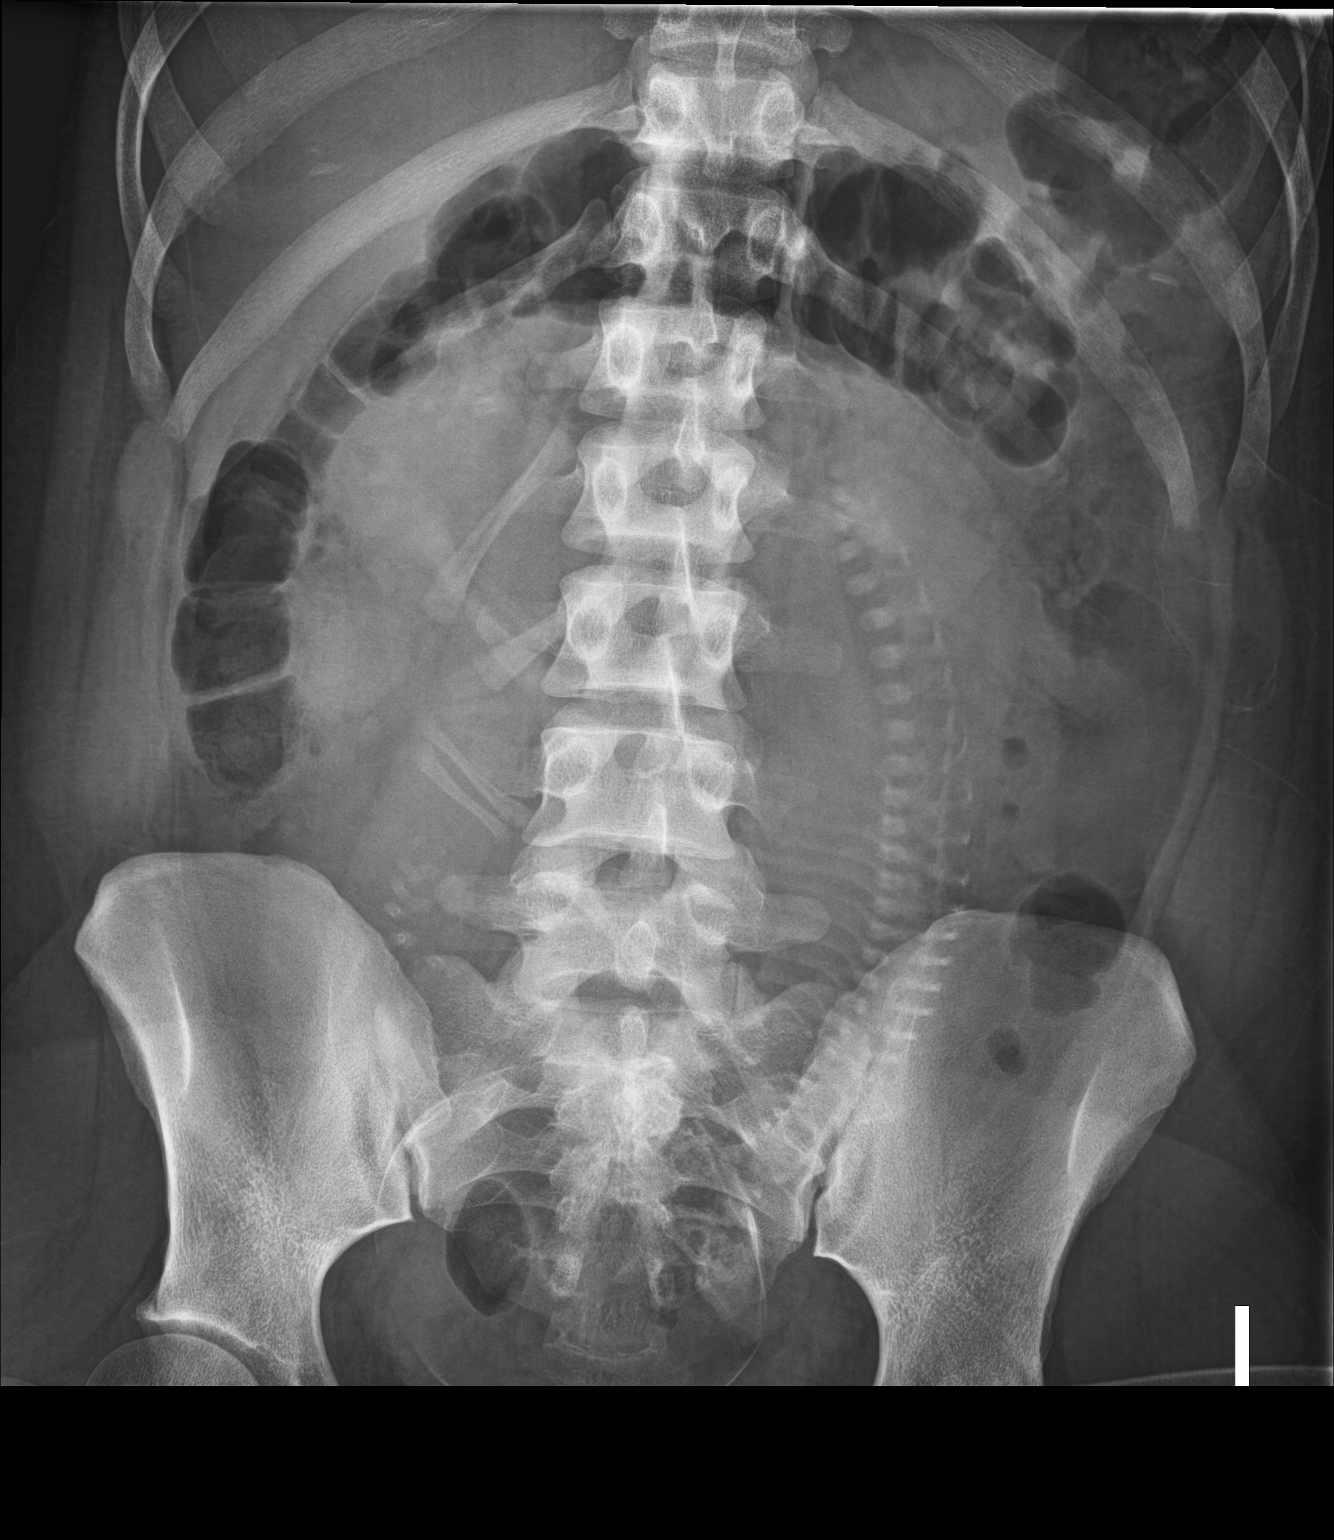

[1 of 1 positions shown; findings below may reference images not displayed]

FINDINGS: Gravid uterus.  Vertex position.

No evidence of bowel obstruction. No dilated loops of small bowel.
Ascending and transverse colon are not decompressed.

Visualized osseous structures are within normal limits.
IMPRESSION: No evidence of bowel obstruction.

Gravid uterus.

## 2017-08-30 DIAGNOSIS — Z124 Encounter for screening for malignant neoplasm of cervix: Secondary | ICD-10-CM | POA: Diagnosis not present

## 2017-08-30 DIAGNOSIS — Z113 Encounter for screening for infections with a predominantly sexual mode of transmission: Secondary | ICD-10-CM | POA: Diagnosis not present

## 2017-08-30 DIAGNOSIS — Z01419 Encounter for gynecological examination (general) (routine) without abnormal findings: Secondary | ICD-10-CM | POA: Diagnosis not present

## 2018-08-11 ENCOUNTER — Emergency Department (HOSPITAL_COMMUNITY)
Admission: EM | Admit: 2018-08-11 | Discharge: 2018-08-11 | Disposition: A | Discharge status: Home / Self Care | Payer: PRIVATE HEALTH INSURANCE | Attending: Emergency Medicine | Admitting: Emergency Medicine

## 2018-08-11 ENCOUNTER — Encounter (HOSPITAL_COMMUNITY): Payer: Self-pay | Admitting: Emergency Medicine

## 2018-08-11 DIAGNOSIS — Z91010 Allergy to peanuts: Secondary | ICD-10-CM | POA: Insufficient documentation

## 2018-08-11 DIAGNOSIS — Y929 Unspecified place or not applicable: Secondary | ICD-10-CM | POA: Diagnosis not present

## 2018-08-11 DIAGNOSIS — S61217A Laceration without foreign body of left little finger without damage to nail, initial encounter: Secondary | ICD-10-CM | POA: Insufficient documentation

## 2018-08-11 DIAGNOSIS — S61219A Laceration without foreign body of unspecified finger without damage to nail, initial encounter: Secondary | ICD-10-CM

## 2018-08-11 DIAGNOSIS — S61215A Laceration without foreign body of left ring finger without damage to nail, initial encounter: Secondary | ICD-10-CM | POA: Insufficient documentation

## 2018-08-11 DIAGNOSIS — Y93G1 Activity, food preparation and clean up: Secondary | ICD-10-CM | POA: Diagnosis not present

## 2018-08-11 DIAGNOSIS — W260XXA Contact with knife, initial encounter: Secondary | ICD-10-CM | POA: Insufficient documentation

## 2018-08-11 DIAGNOSIS — Y999 Unspecified external cause status: Secondary | ICD-10-CM | POA: Diagnosis not present

## 2018-08-11 MED ORDER — LIDOCAINE HCL 2 % IJ SOLN
20.0000 mL | Freq: Once | INTRAMUSCULAR | Status: AC
  Administered 2018-08-11: 400 mg
  Filled 2018-08-11: qty 20

## 2018-08-11 NOTE — Discharge Instructions (Addendum)
Please read attached information. If you experience any new or worsening signs or symptoms please return to the emergency room for evaluation. Please follow-up with your primary care provider or specialist as discussed.  °

## 2018-08-11 NOTE — ED Provider Notes (Signed)
[facility] Provider Note   CSN: [id number] Arrival date & time: 08/11/18  1242     History   Chief Complaint Chief Complaint  Patient presents with  . Extremity Laceration    HPI Lolita LenzBrittany Ficco is a 27 y.o. female.  HPI   27 year old female presents today with laceration to herleft hand. Patient notes she was attempting to get a seat out of an avocado when the knife slipped causing a laceration in between the fourth and fifth digits. She has bleeding controlled after pressure, denies any loss of sensation or function of the fingers. She notes her tetanus is up-to-date.  Past Medical History:  Diagnosis Date  . IBS (irritable bowel syndrome)     Patient Active Problem List   Diagnosis Date Noted  . Delivered by cesarean section 10/21/2016  . Indication for care or intervention related to labor and delivery 10/20/2016  . Constipation 08/20/2016    Past Surgical History:  Procedure Laterality Date  . CESAREAN SECTION N/A 10/21/2016   Procedure: CESAREAN SECTION;  Surgeon: Essie HartWalda Pinn, MD;  [facility];  Service: Obstetrics;  Laterality: N/A;  . NO PAST SURGERIES       OB History    Gravida  1   Para  1   Term  1   Preterm      AB      Living  1     SAB      TAB      Ectopic      Multiple  0   Live Births  1            Home Medications    Prior to Admission medications   Not on File    Family History Family History  Problem Relation Age of Onset  . Cancer Neg Hx   . Diabetes Neg Hx   . Hypertension Neg Hx     Social History Social History   Tobacco Use  . Smoking status: Never Smoker  . Smokeless tobacco: Never Used  Substance Use Topics  . Alcohol use: No  . Drug use: No     Allergies   Food; Peanut-containing drug products; and Dicloxacillin   Review of Systems Review of Systems  All other systems reviewed and are negative.    Physical Exam Updated Vital  Signs BP 119/87   Pulse 73   Temp 98.3 F (36.8 C) (Oral)   Resp 18   SpO2 100%   Physical Exam  Constitutional: She is oriented to person, place, and time. She appears well-developed and well-nourished.  HENT:  Head: Normocephalic and atraumatic.  Eyes: Pupils are equal, round, and reactive to light. Conjunctivae are normal. Right eye exhibits no discharge. Left eye exhibits no discharge. No scleral icterus.  Neck: Normal range of motion. No JVD present. No tracheal deviation present.  Pulmonary/Chest: Effort normal. No stridor.  Musculoskeletal:  ic m laceration through the radial 5th finger, 1 cm laceration through the ulnar 4th finger - no deep space involvement, full active ROM against resistance, sensation intact   Neurological: She is alert and oriented to person, place, and time. Coordination normal.  Psychiatric: She has a normal mood and affect. Her behavior is normal. Judgment and thought content normal.  Nursing note and vitals reviewed.   ED Treatments / Results  Labs (all labs ordered are listed, but only abnormal results are displayed) Labs Reviewed - No data to display  EKG None  Radiology  No results found.  Procedures .Marland Kitchen.Laceration Repair Date/Time: 08/11/2018 4:31 PM Performed by: Eyvonne MechanicHedges, Betti Goodenow, PA-C Authorized by: Eyvonne MechanicHedges, Bevin Das, PA-C   Consent:    Consent obtained:  Verbal   Consent given by:  Patient   Risks discussed:  Infection and pain   Alternatives discussed:  No treatment and delayed treatment Anesthesia (see MAR for exact dosages):    Anesthesia method:  Local infiltration   Local anesthetic:  Lidocaine 2% w/o epi Laceration details:    Location:  Finger   Finger location:  L ring finger   Length (cm):  1 Repair type:    Repair type:  Simple Pre-procedure details:    Preparation:  Patient was prepped and draped in usual sterile fashion Exploration:    Wound extent: no fascia violation noted, no foreign bodies/material noted, no  muscle damage noted, no nerve damage noted, no tendon damage noted, no underlying fracture noted and no vascular damage noted   Treatment:    Area cleansed with:  Saline   Amount of cleaning:  Standard   Irrigation solution:  Sterile saline   Visualized foreign bodies/material removed: no   Skin repair:    Repair method:  Sutures   Suture size:  4-0   Suture material:  Fast-absorbing gut   Suture technique:  Simple interrupted   Number of sutures:  4 Approximation:    Approximation:  Close Post-procedure details:    Dressing:  Antibiotic ointment   Patient tolerance of procedure:  Tolerated well, no immediate complications .Marland Kitchen.Laceration Repair Date/Time: 08/11/2018 4:32 PM Performed by: Eyvonne MechanicHedges, Emit Kuenzel, PA-C Authorized by: Eyvonne MechanicHedges, Clinton Wahlberg, PA-C   Consent:    Consent obtained:  Verbal   Consent given by:  Patient   Risks discussed:  Infection   Alternatives discussed:  Delayed treatment and no treatment Anesthesia (see MAR for exact dosages):    Anesthesia method:  Local infiltration   Local anesthetic:  Lidocaine 2% w/o epi Laceration details:    Location:  Finger   Finger location:  L small finger   Length (cm):  0.5 Repair type:    Repair type:  Simple Pre-procedure details:    Preparation:  Patient was prepped and draped in usual sterile fashion Exploration:    Hemostasis achieved with:  Direct pressure   Wound exploration: wound explored through full range of motion and entire depth of wound probed and visualized     Wound extent: no fascia violation noted, no foreign bodies/material noted, no muscle damage noted, no nerve damage noted, no tendon damage noted, no underlying fracture noted and no vascular damage noted     Contaminated: no   Treatment:    Area cleansed with:  Saline   Amount of cleaning:  Standard   Irrigation solution:  Sterile saline   Visualized foreign bodies/material removed: no   Skin repair:    Repair method:  Sutures   Suture size:  4-0    Suture material:  Fast-absorbing gut   Suture technique:  Simple interrupted   Number of sutures:  1 Approximation:    Approximation:  Close Post-procedure details:    Dressing:  Antibiotic ointment   Patient tolerance of procedure:  Tolerated well, no immediate complications   (including critical care time)  Medications Ordered in ED Medications  lidocaine (XYLOCAINE) 2 % (with pres) injection 400 mg (400 mg Infiltration Given 08/11/18 1504)     Initial Impression / Assessment and Plan / ED Course  I have reviewed the triage vital signs and the nursing  notes.  Pertinent labs & imaging results that were available during my care of the patient were reviewed by me and considered in my medical decision making (see chart for details).     Labs:   Imaging:  Consults:  Therapeutics: lidocaine   Discharge Meds:   Assessment/Plan:27 year old female presents today with lacerations. Wounds cleansed and repaired here without complication, wound care instructions given return precautions given. Patient verbalized understanding and agreement to today's plan had no further questions or concerns at time of discharge.   Final Clinical Impressions(s) / ED Diagnoses   Final diagnoses:  Laceration of finger of left hand without foreign body without damage to nail, unspecified finger, initial encounter    ED Discharge Orders    None       Rosalio Loud 08/11/18 1633    Loren Racer, MD 08/11/18 [phone]

## 2018-08-11 NOTE — ED Notes (Signed)
Pt stable, ambulatory, states understanding of discharge instructions 

## 2018-08-11 NOTE — ED Triage Notes (Signed)
Patient here with hand laceration on left hand. Hemorrhage controlled. Patient was trying to cut an avocado and slipped and cut her hand. Patient unsure of last tetanus vaccine. Patient alert, oriented, and in no apparent distress at this time.

## 2018-09-04 DIAGNOSIS — Z01419 Encounter for gynecological examination (general) (routine) without abnormal findings: Secondary | ICD-10-CM | POA: Diagnosis not present

## 2018-09-04 DIAGNOSIS — Z3040 Encounter for surveillance of contraceptives, unspecified: Secondary | ICD-10-CM | POA: Diagnosis not present

## 2018-09-05 DIAGNOSIS — Z01419 Encounter for gynecological examination (general) (routine) without abnormal findings: Secondary | ICD-10-CM | POA: Diagnosis not present

## 2018-09-29 DIAGNOSIS — Z3040 Encounter for surveillance of contraceptives, unspecified: Secondary | ICD-10-CM | POA: Diagnosis not present

## 2018-09-29 DIAGNOSIS — Z30430 Encounter for insertion of intrauterine contraceptive device: Secondary | ICD-10-CM | POA: Diagnosis not present

## 2018-11-11 DIAGNOSIS — Z30431 Encounter for routine checking of intrauterine contraceptive device: Secondary | ICD-10-CM | POA: Diagnosis not present

## 2019-07-16 DIAGNOSIS — K921 Melena: Secondary | ICD-10-CM | POA: Diagnosis not present

## 2019-07-16 DIAGNOSIS — Z0000 Encounter for general adult medical examination without abnormal findings: Secondary | ICD-10-CM | POA: Diagnosis not present

## 2019-07-16 DIAGNOSIS — K5900 Constipation, unspecified: Secondary | ICD-10-CM | POA: Diagnosis not present

## 2019-08-11 DIAGNOSIS — K625 Hemorrhage of anus and rectum: Secondary | ICD-10-CM | POA: Diagnosis not present

## 2019-08-11 DIAGNOSIS — K5904 Chronic idiopathic constipation: Secondary | ICD-10-CM | POA: Diagnosis not present

## 2019-09-10 DIAGNOSIS — Z01419 Encounter for gynecological examination (general) (routine) without abnormal findings: Secondary | ICD-10-CM | POA: Diagnosis not present

## 2019-09-10 DIAGNOSIS — R635 Abnormal weight gain: Secondary | ICD-10-CM | POA: Diagnosis not present

## 2019-09-10 DIAGNOSIS — B373 Candidiasis of vulva and vagina: Secondary | ICD-10-CM | POA: Diagnosis not present

## 2019-09-10 DIAGNOSIS — Z124 Encounter for screening for malignant neoplasm of cervix: Secondary | ICD-10-CM | POA: Diagnosis not present

## 2019-10-26 DIAGNOSIS — Z6838 Body mass index (BMI) 38.0-38.9, adult: Secondary | ICD-10-CM | POA: Diagnosis not present

## 2019-10-26 DIAGNOSIS — Z713 Dietary counseling and surveillance: Secondary | ICD-10-CM | POA: Diagnosis not present

## 2019-11-19 DIAGNOSIS — Z6838 Body mass index (BMI) 38.0-38.9, adult: Secondary | ICD-10-CM | POA: Diagnosis not present

## 2019-11-19 DIAGNOSIS — Z713 Dietary counseling and surveillance: Secondary | ICD-10-CM | POA: Diagnosis not present

## 2019-12-17 DIAGNOSIS — Z6838 Body mass index (BMI) 38.0-38.9, adult: Secondary | ICD-10-CM | POA: Diagnosis not present

## 2019-12-17 DIAGNOSIS — R634 Abnormal weight loss: Secondary | ICD-10-CM | POA: Diagnosis not present

## 2020-01-19 ENCOUNTER — Other Ambulatory Visit: Payer: Self-pay | Admitting: Cardiology

## 2020-01-19 DIAGNOSIS — Z20822 Contact with and (suspected) exposure to covid-19: Secondary | ICD-10-CM

## 2020-01-20 LAB — NOVEL CORONAVIRUS, NAA: SARS-CoV-2, NAA: NOT DETECTED

## 2020-01-21 DIAGNOSIS — Z6838 Body mass index (BMI) 38.0-38.9, adult: Secondary | ICD-10-CM | POA: Diagnosis not present

## 2020-01-21 DIAGNOSIS — R634 Abnormal weight loss: Secondary | ICD-10-CM | POA: Diagnosis not present

## 2020-02-26 DIAGNOSIS — Z6838 Body mass index (BMI) 38.0-38.9, adult: Secondary | ICD-10-CM | POA: Diagnosis not present

## 2020-02-26 DIAGNOSIS — R634 Abnormal weight loss: Secondary | ICD-10-CM | POA: Diagnosis not present

## 2020-03-17 ENCOUNTER — Ambulatory Visit: Payer: 59 | Attending: Internal Medicine

## 2020-03-17 DIAGNOSIS — Z23 Encounter for immunization: Secondary | ICD-10-CM

## 2020-03-17 NOTE — Progress Notes (Signed)
   Covid-19 Vaccination Clinic  Name:  Heather Murphy    MRN: [id number] DOB: 04-20-1991  03/17/2020  [name] was observed post Covid-19 immunization for 30 minutes based on pre-vaccination screening without incident. She was provided with Vaccine Information Sheet and instruction to access the V-Safe system.   [name] was instructed to call 911 with any severe reactions post vaccine: Marland Kitchen Difficulty breathing  . Swelling of face and throat  . A fast heartbeat  . A bad rash all over body  . Dizziness and weakness   Immunizations Administered    Name Date Dose VIS Date Route   Pfizer COVID-19 Vaccine 03/17/2020  1:14 PM 0.3 mL 12/11/2019 Intramuscular   Manufacturer: [employer], [employer]   Lot: [id number]   NDC: [address]-7

## 2020-03-25 DIAGNOSIS — Z6838 Body mass index (BMI) 38.0-38.9, adult: Secondary | ICD-10-CM | POA: Diagnosis not present

## 2020-04-08 ENCOUNTER — Ambulatory Visit: Payer: 59 | Attending: Internal Medicine

## 2020-04-08 DIAGNOSIS — Z23 Encounter for immunization: Secondary | ICD-10-CM

## 2020-04-08 NOTE — Progress Notes (Signed)
   Covid-19 Vaccination Clinic  Name:  Heather Murphy    MRN: [id number] DOB: 05-27-1991  04/08/2020  Ms. Vidaurri was observed post Covid-19 immunization for 15 minutes without incident. She was provided with Vaccine Information Sheet and instruction to access the V-Safe system.   Ms. Burdin was instructed to call 911 with any severe reactions post vaccine: Marland Kitchen Difficulty breathing  . Swelling of face and throat  . A fast heartbeat  . A bad rash all over body  . Dizziness and weakness   Immunizations Administered    Name Date Dose VIS Date Route   Pfizer COVID-19 Vaccine 04/08/2020  1:52 PM 0.3 mL 12/11/2019 Intramuscular   Manufacturer: [employer], [employer]   Lot: [id number]   NDC: [address]-4

## 2020-04-11 ENCOUNTER — Ambulatory Visit: Payer: Self-pay

## 2021-03-06 DIAGNOSIS — Z6838 Body mass index (BMI) 38.0-38.9, adult: Secondary | ICD-10-CM | POA: Diagnosis not present

## 2021-03-06 DIAGNOSIS — Z124 Encounter for screening for malignant neoplasm of cervix: Secondary | ICD-10-CM | POA: Diagnosis not present

## 2021-03-06 DIAGNOSIS — Z01419 Encounter for gynecological examination (general) (routine) without abnormal findings: Secondary | ICD-10-CM | POA: Diagnosis not present

## 2021-04-06 DIAGNOSIS — Z6838 Body mass index (BMI) 38.0-38.9, adult: Secondary | ICD-10-CM | POA: Diagnosis not present

## 2021-04-06 DIAGNOSIS — R634 Abnormal weight loss: Secondary | ICD-10-CM | POA: Diagnosis not present

## 2021-05-05 DIAGNOSIS — Z6838 Body mass index (BMI) 38.0-38.9, adult: Secondary | ICD-10-CM | POA: Diagnosis not present

## 2021-08-15 DIAGNOSIS — Z6838 Body mass index (BMI) 38.0-38.9, adult: Secondary | ICD-10-CM | POA: Diagnosis not present

## 2021-10-18 DIAGNOSIS — Z6838 Body mass index (BMI) 38.0-38.9, adult: Secondary | ICD-10-CM | POA: Diagnosis not present

## 2022-01-22 DIAGNOSIS — Z6832 Body mass index (BMI) 32.0-32.9, adult: Secondary | ICD-10-CM | POA: Diagnosis not present

## 2022-01-22 DIAGNOSIS — Z130 Encounter for screening for diseases of the blood and blood-forming organs and certain disorders involving the immune mechanism: Secondary | ICD-10-CM | POA: Diagnosis not present

## 2022-01-22 DIAGNOSIS — Z13220 Encounter for screening for lipoid disorders: Secondary | ICD-10-CM | POA: Diagnosis not present

## 2022-01-22 DIAGNOSIS — Z0000 Encounter for general adult medical examination without abnormal findings: Secondary | ICD-10-CM | POA: Diagnosis not present

## 2022-03-14 DIAGNOSIS — Z124 Encounter for screening for malignant neoplasm of cervix: Secondary | ICD-10-CM | POA: Diagnosis not present

## 2022-03-14 DIAGNOSIS — Z01419 Encounter for gynecological examination (general) (routine) without abnormal findings: Secondary | ICD-10-CM | POA: Diagnosis not present

## 2022-03-14 DIAGNOSIS — Z808 Family history of malignant neoplasm of other organs or systems: Secondary | ICD-10-CM | POA: Diagnosis not present

## 2022-03-14 DIAGNOSIS — Z113 Encounter for screening for infections with a predominantly sexual mode of transmission: Secondary | ICD-10-CM | POA: Diagnosis not present

## 2022-12-12 DIAGNOSIS — Z30432 Encounter for removal of intrauterine contraceptive device: Secondary | ICD-10-CM | POA: Diagnosis not present

## 2022-12-12 DIAGNOSIS — Z3040 Encounter for surveillance of contraceptives, unspecified: Secondary | ICD-10-CM | POA: Diagnosis not present

## 2023-03-18 DIAGNOSIS — Z131 Encounter for screening for diabetes mellitus: Secondary | ICD-10-CM | POA: Diagnosis not present

## 2023-03-18 DIAGNOSIS — Z130 Encounter for screening for diseases of the blood and blood-forming organs and certain disorders involving the immune mechanism: Secondary | ICD-10-CM | POA: Diagnosis not present

## 2023-03-18 DIAGNOSIS — Z1321 Encounter for screening for nutritional disorder: Secondary | ICD-10-CM | POA: Diagnosis not present

## 2023-03-18 DIAGNOSIS — Z13220 Encounter for screening for lipoid disorders: Secondary | ICD-10-CM | POA: Diagnosis not present

## 2023-03-18 DIAGNOSIS — Z1329 Encounter for screening for other suspected endocrine disorder: Secondary | ICD-10-CM | POA: Diagnosis not present

## 2023-03-27 DIAGNOSIS — Z113 Encounter for screening for infections with a predominantly sexual mode of transmission: Secondary | ICD-10-CM | POA: Diagnosis not present

## 2023-03-27 DIAGNOSIS — Z8742 Personal history of other diseases of the female genital tract: Secondary | ICD-10-CM | POA: Diagnosis not present

## 2023-03-27 DIAGNOSIS — Z01419 Encounter for gynecological examination (general) (routine) without abnormal findings: Secondary | ICD-10-CM | POA: Diagnosis not present

## 2023-03-27 DIAGNOSIS — Z3040 Encounter for surveillance of contraceptives, unspecified: Secondary | ICD-10-CM | POA: Diagnosis not present

## 2023-04-15 DIAGNOSIS — M2021 Hallux rigidus, right foot: Secondary | ICD-10-CM | POA: Diagnosis not present

## 2023-04-15 DIAGNOSIS — R52 Pain, unspecified: Secondary | ICD-10-CM | POA: Diagnosis not present

## 2023-04-15 DIAGNOSIS — M2022 Hallux rigidus, left foot: Secondary | ICD-10-CM | POA: Diagnosis not present

## 2023-04-15 DIAGNOSIS — M2011 Hallux valgus (acquired), right foot: Secondary | ICD-10-CM | POA: Diagnosis not present

## 2023-04-15 DIAGNOSIS — M2012 Hallux valgus (acquired), left foot: Secondary | ICD-10-CM | POA: Diagnosis not present

## 2023-04-24 DIAGNOSIS — R251 Tremor, unspecified: Secondary | ICD-10-CM | POA: Diagnosis not present

## 2023-04-24 DIAGNOSIS — G245 Blepharospasm: Secondary | ICD-10-CM | POA: Diagnosis not present

## 2024-03-09 ENCOUNTER — Other Ambulatory Visit: Payer: Self-pay | Admitting: Obstetrics and Gynecology

## 2024-03-09 DIAGNOSIS — R238 Other skin changes: Secondary | ICD-10-CM | POA: Diagnosis not present

## 2024-03-09 DIAGNOSIS — L720 Epidermal cyst: Secondary | ICD-10-CM | POA: Diagnosis not present

## 2024-03-09 DIAGNOSIS — L723 Sebaceous cyst: Secondary | ICD-10-CM | POA: Diagnosis not present

## 2024-03-12 LAB — DERMATOLOGY PATHOLOGY

## 2024-03-25 DIAGNOSIS — Z09 Encounter for follow-up examination after completed treatment for conditions other than malignant neoplasm: Secondary | ICD-10-CM | POA: Diagnosis not present

## 2024-04-22 DIAGNOSIS — Z01419 Encounter for gynecological examination (general) (routine) without abnormal findings: Secondary | ICD-10-CM | POA: Diagnosis not present

## 2024-04-22 DIAGNOSIS — Z139 Encounter for screening, unspecified: Secondary | ICD-10-CM | POA: Diagnosis not present

## 2024-04-22 DIAGNOSIS — Z113 Encounter for screening for infections with a predominantly sexual mode of transmission: Secondary | ICD-10-CM | POA: Diagnosis not present

## 2024-04-22 DIAGNOSIS — Z3040 Encounter for surveillance of contraceptives, unspecified: Secondary | ICD-10-CM | POA: Diagnosis not present

## 2024-04-22 DIAGNOSIS — R87810 Cervical high risk human papillomavirus (HPV) DNA test positive: Secondary | ICD-10-CM | POA: Diagnosis not present

## 2024-10-15 ENCOUNTER — Other Ambulatory Visit (HOSPITAL_BASED_OUTPATIENT_CLINIC_OR_DEPARTMENT_OTHER): Payer: Self-pay

## 2024-10-15 MED ORDER — FLUZONE 0.5 ML IM SUSY
0.5000 mL | PREFILLED_SYRINGE | Freq: Once | INTRAMUSCULAR | 0 refills | Status: AC
  Filled 2024-10-15: qty 0.5, 1d supply, fill #0

## 2024-11-23 ENCOUNTER — Other Ambulatory Visit: Payer: Self-pay

## 2024-11-23 ENCOUNTER — Encounter (HOSPITAL_BASED_OUTPATIENT_CLINIC_OR_DEPARTMENT_OTHER): Payer: Self-pay | Admitting: *Deleted

## 2024-11-23 ENCOUNTER — Emergency Department (HOSPITAL_BASED_OUTPATIENT_CLINIC_OR_DEPARTMENT_OTHER)
Admission: EM | Admit: 2024-11-23 | Discharge: 2024-11-23 | Disposition: A | Discharge status: Home / Self Care | Attending: Emergency Medicine | Admitting: Emergency Medicine

## 2024-11-23 DIAGNOSIS — Z91010 Allergy to peanuts: Secondary | ICD-10-CM | POA: Insufficient documentation

## 2024-11-23 DIAGNOSIS — N939 Abnormal uterine and vaginal bleeding, unspecified: Secondary | ICD-10-CM | POA: Diagnosis not present

## 2024-11-23 LAB — CBC WITH DIFFERENTIAL/PLATELET
Abs Immature Granulocytes: 0.01 K/uL (ref 0.00–0.07)
Basophils Absolute: 0 K/uL (ref 0.0–0.1)
Basophils Relative: 1 %
Eosinophils Absolute: 0 K/uL (ref 0.0–0.5)
Eosinophils Relative: 1 %
HCT: 42.4 % (ref 36.0–46.0)
Hemoglobin: 14 g/dL (ref 12.0–15.0)
Immature Granulocytes: 0 %
Lymphocytes Relative: 35 %
Lymphs Abs: 2 K/uL (ref 0.7–4.0)
MCH: 29 pg (ref 26.0–34.0)
MCHC: 33 g/dL (ref 30.0–36.0)
MCV: 88 fL (ref 80.0–100.0)
Monocytes Absolute: 0.4 K/uL (ref 0.1–1.0)
Monocytes Relative: 6 %
Neutro Abs: 3.2 K/uL (ref 1.7–7.7)
Neutrophils Relative %: 57 %
Platelets: 251 K/uL (ref 150–400)
RBC: 4.82 MIL/uL (ref 3.87–5.11)
RDW: 12.1 % (ref 11.5–15.5)
WBC: 5.6 K/uL (ref 4.0–10.5)
nRBC: 0 % (ref 0.0–0.2)

## 2024-11-23 LAB — URINALYSIS, ROUTINE W REFLEX MICROSCOPIC
Bilirubin Urine: NEGATIVE
Glucose, UA: NEGATIVE mg/dL
Hgb urine dipstick: NEGATIVE
Ketones, ur: NEGATIVE mg/dL
Leukocytes,Ua: NEGATIVE
Nitrite: NEGATIVE
Protein, ur: NEGATIVE mg/dL
Specific Gravity, Urine: 1.024 (ref 1.005–1.030)
pH: 6.5 (ref 5.0–8.0)

## 2024-11-23 LAB — PREGNANCY, URINE: Preg Test, Ur: NEGATIVE

## 2024-11-23 NOTE — ED Notes (Signed)
 Pt in gown, pelvic cart at bedside

## 2024-11-23 NOTE — ED Triage Notes (Signed)
 Pt states that she is having irregular vaginal bleeding.  Pt states that she had vaginal bleeding 10/3-10/7 and then 10/31-11/8 and then again 11/16-current and she is concerned about this.  Home pregnancy test last Sunday was negative.  Pt appears in no acute distress, was not able to get into her MD, she was not soaking through tampons, bleeding is not heavy.

## 2024-11-23 NOTE — Discharge Instructions (Addendum)
 As we discussed all of your lab work today was within normal range.  The urine pregnancy was negative and your pelvic exam was normal.  Please return for further evaluation if you develop persistent heavy vaginal bleeding, dizziness, palpitations, loss of consciousness, shortness of breath, or chest pain, or if any other concerning symptoms develop.  Follow-up with your OB/GYN as scheduled in December for further evaluation of symptoms.  Continue to track your cycles using your app.

## 2024-11-23 NOTE — ED Provider Notes (Signed)
 [place] [facility] AT [facility] Provider Note   CSN: [id number] Arrival date & time: 11/23/24  1022     Patient presents with: No chief complaint on file.   Heather Murphy is a 33 y.o. female.   Patient is here for evaluation of irregular vaginal bleeding.  Patient keeps track of her menstrual cycle via an app so she is able to confirm she had vaginal bleeding 10/3-10/7 and then 10/31-11/8 and then again 11/16-current and she is concerned about this.  Bleeding today is very light and just noticed on toilet paper.  On 11/16 she had an episode of lightheadedness that resolved after drinking a bottle of water.  She has not had any episodes of syncope.  On that day she used 1 superheavy tampon otherwise, since that time she has only needed 2 panty liners daily.  In this past week she passed darker appearing blood and more mucus appearing blood but, denies passing of large clots.  She previously had an IUD that was removed either the beginning of this year or the end of last year.  Otherwise she is not on contraceptives.  Her and her husband married in early October so she figured the 2 periods in October where due to stress.  She spoke with her OB/GYN about this abnormal vaginal bleeding but was unable to get into their office until the middle of December.  Patient is not taking a blood thinner.  She has dysuria or polyuria.  She denies persistent abdominal pain further stating, intermittent abdominal cramping typical of menstrual cycle.  She is not worried about STIs as she is up-to-date with testing with her established physician.  The history is provided by the patient.       Prior to Admission medications   Not on File    Allergies: Food, Peanut-containing drug products, and Dicloxacillin     Review of Systems  Genitourinary:  Positive for vaginal bleeding. Negative for vaginal discharge and vaginal pain.    Updated Vital Signs BP (!) 138/98 (BP Location: Right  Arm)   Pulse 64   Temp 98.3 F (36.8 C) (Oral)   Resp 17   LMP 11/15/2024   SpO2 100%   Physical Exam Vitals and nursing note reviewed. Exam conducted with a chaperone present.  Constitutional:      General: She is not in acute distress.    Appearance: Normal appearance. She is normal weight. She is not ill-appearing, toxic-appearing or diaphoretic.  HENT:     Head: Normocephalic and atraumatic.     Mouth/Throat:     Mouth: Mucous membranes are moist.  Eyes:     General: No scleral icterus.    Extraocular Movements: Extraocular movements intact.     Conjunctiva/sclera: Conjunctivae normal.  Cardiovascular:     Rate and Rhythm: Normal rate and regular rhythm.     Pulses: Normal pulses.  Pulmonary:     Effort: Pulmonary effort is normal. No respiratory distress.  Abdominal:     General: Abdomen is flat. Bowel sounds are normal. There is no distension.     Palpations: Abdomen is soft.     Tenderness: There is no abdominal tenderness. There is no guarding.  Genitourinary:    General: Normal vulva.     Exam position: Lithotomy position.     Pubic Area: No rash or pubic lice.      Labia:        Right: No rash, tenderness, lesion or injury.  Left: No rash, tenderness, lesion or injury.      Urethra: No prolapse, urethral pain, urethral swelling or urethral lesion.     Vagina: Normal.     Cervix: Cervical bleeding (Small amount of dark red blood with some mucus) present. No cervical motion tenderness, friability, lesion, erythema or eversion.     Comments: No Chadwick sign noted. Musculoskeletal:        General: Normal range of motion.  Skin:    General: Skin is warm and dry.     Coloration: Skin is not jaundiced or pale.  Neurological:     Mental Status: She is alert and oriented to person, place, and time.     (all labs ordered are listed, but only abnormal results are displayed) Labs Reviewed  CBC WITH DIFFERENTIAL/PLATELET  URINALYSIS, ROUTINE W REFLEX  MICROSCOPIC  PREGNANCY, URINE    EKG: None  Radiology: No results found.   .Pelvic exam  Date/Time: 11/23/2024 1:35 PM  Performed by: Rosina Almarie LABOR, PA-C Authorized by: Rosina Almarie LABOR, PA-C  Consent: Verbal consent obtained. Written consent not obtained Risks and benefits: risks, benefits and alternatives were discussed Consent given by: patient Patient understanding: patient states understanding of the procedure being performed Patient consent: the patient's understanding of the procedure matches consent given Procedure consent: procedure consent matches procedure scheduled Relevant documents: relevant documents present and verified Test results: test results available and properly labeled Site marked: the operative site was not marked Imaging studies: imaging studies not available Patient identity confirmed: verbally with patient, provided demographic data and hospital-assigned identification number Time out: Immediately prior to procedure a time out was called to verify the correct patient, procedure, equipment, support staff and site/side marked as required. Preparation: Patient was prepped and draped in the usual sterile fashion. Local anesthesia used: no  Anesthesia: Local anesthesia used: no  Sedation: Patient sedated: no  Patient tolerance: patient tolerated the procedure well with no immediate complications      Medications Ordered in the ED - No data to display   Patient presents to the ED for concern of irregular vaginal bleeding, this involves an extensive number of treatment options, and is a complaint that carries with it a high risk of complications and morbidity.  The differential diagnosis includes ectopic pregnancy, hormonal imbalance, hypovolemia, miscarriage, trauma.  Pregnancy test negative ruling out ectopic pregnancy, and lowering suspicion for miscarriage as we would expect a positive exam for about 2 to 4 weeks post miscarriage  depending on how far along they were.  Patient denies trauma.  Lab work reassuring that she is not hypovolemic.   Lab Tests:  I Ordered, and personally interpreted labs.  The pertinent results include: Blood work is unremarkable.  Urinalysis is unremarkable.  Negative pregnancy test.   Problem List / ED Course:  Patient is here for evaluation of irregular vaginal bleeding.  Blood work and urinalysis are unremarkable.  Pregnancy test negative.  This irregular vaginal bleeding is suspected to be related to hormonal imbalances.  Would recommend outpatient follow-up with OB/GYN or primary care for further evaluation and following up of symptoms.  Reevaluation:  After the interventions noted above, I reevaluated the patient and found that they have :stayed the same.  She does report improvement of anxiety with normal lab results and evaluation.   Dispostion:  After consideration of the diagnostic results and the patients response to treatment, I feel that the patent would benefit from continued monitoring of symptoms in the home setting.  She  will follow-up with her OB/GYN in mid December as scheduled.  She will continue to monitor symptoms and keep a log of vaginal bleeding in her phone app.  She will return if any concerning symptoms develop.   Medical Decision Making Amount and/or Complexity of Data Reviewed Labs: ordered.   Final diagnoses:  Vaginal bleeding, abnormal    ED Discharge Orders     None          Rosina Almarie LABOR, PA-C 11/23/24 1519    Bari Roxie HERO, DO 11/26/24 1309
